# Patient Record
Sex: Female | Born: 1975 | Race: White | Hispanic: No | Marital: Married | Smoking: Never smoker
Health system: Southern US, Community
[De-identification: ages and names within clinical notes are randomized; demographics above are authoritative.]

## PROBLEM LIST (undated history)

## (undated) DIAGNOSIS — F4310 Post-traumatic stress disorder, unspecified: Secondary | ICD-10-CM

## (undated) DIAGNOSIS — F341 Dysthymic disorder: Secondary | ICD-10-CM

## (undated) DIAGNOSIS — T7422XA Child sexual abuse, confirmed, initial encounter: Secondary | ICD-10-CM

## (undated) DIAGNOSIS — I10 Essential (primary) hypertension: Secondary | ICD-10-CM

## (undated) DIAGNOSIS — F411 Generalized anxiety disorder: Secondary | ICD-10-CM

## (undated) HISTORY — PX: TONSILLECTOMY: SUR1361

## (undated) HISTORY — DX: Dysthymic disorder: F34.1

## (undated) HISTORY — DX: Generalized anxiety disorder: F41.1

## (undated) HISTORY — DX: Post-traumatic stress disorder, unspecified: F43.10

## (undated) HISTORY — DX: Child sexual abuse, confirmed, initial encounter: T74.22XA

---

## 2003-10-02 ENCOUNTER — Other Ambulatory Visit: Admission: RE | Admit: 2003-10-02 | Discharge: 2003-10-02 | Payer: Self-pay | Admitting: Family Medicine

## 2005-04-18 ENCOUNTER — Other Ambulatory Visit: Admission: RE | Admit: 2005-04-18 | Discharge: 2005-04-18 | Payer: Self-pay | Admitting: Family Medicine

## 2007-11-29 ENCOUNTER — Encounter: Admission: RE | Admit: 2007-11-29 | Discharge: 2007-11-29 | Payer: Self-pay | Admitting: Family Medicine

## 2008-07-19 ENCOUNTER — Emergency Department (HOSPITAL_COMMUNITY): Admission: EM | Admit: 2008-07-19 | Discharge: 2008-07-19 | Payer: Self-pay | Admitting: Physician Assistant

## 2009-03-23 ENCOUNTER — Emergency Department (HOSPITAL_COMMUNITY): Admission: EM | Admit: 2009-03-23 | Discharge: 2009-03-23 | Payer: Self-pay | Admitting: Emergency Medicine

## 2010-05-15 ENCOUNTER — Ambulatory Visit: Payer: Self-pay | Admitting: Family Medicine

## 2010-05-15 DIAGNOSIS — E669 Obesity, unspecified: Secondary | ICD-10-CM

## 2010-05-16 ENCOUNTER — Telehealth: Payer: Self-pay | Admitting: Family Medicine

## 2010-05-22 ENCOUNTER — Encounter: Payer: Self-pay | Admitting: Family Medicine

## 2010-05-22 ENCOUNTER — Ambulatory Visit: Payer: Self-pay | Admitting: Family Medicine

## 2010-05-22 LAB — CONVERTED CEMR LAB
ALT: 11 units/L (ref 0–35)
AST: 12 units/L (ref 0–37)
Albumin: 4.3 g/dL (ref 3.5–5.2)
Alkaline Phosphatase: 67 units/L (ref 39–117)
BUN: 11 mg/dL (ref 6–23)
CO2: 24 meq/L (ref 19–32)
Calcium: 9 mg/dL (ref 8.4–10.5)
Chloride: 106 meq/L (ref 96–112)
Cholesterol: 182 mg/dL (ref 0–200)
Creatinine, Ser: 0.58 mg/dL (ref 0.40–1.20)
Glucose, Bld: 100 mg/dL — ABNORMAL HIGH (ref 70–99)
HCT: 37.5 % (ref 36.0–46.0)
HCV Ab: NEGATIVE
HDL: 51 mg/dL (ref >39)
Hemoglobin: 11.7 g/dL — ABNORMAL LOW (ref 12.0–15.0)
LDL Cholesterol: 117 mg/dL — ABNORMAL HIGH (ref 0–99)
MCHC: 31.2 g/dL (ref 30.0–36.0)
MCV: 88.4 fL (ref 78.0–100.0)
Platelets: 237 10*3/uL (ref 150–400)
Potassium: 3.9 meq/L (ref 3.5–5.3)
RBC: 4.24 M/uL (ref 3.87–5.11)
RDW: 13.3 % (ref 11.5–15.5)
Sodium: 139 meq/L (ref 135–145)
TSH: 3.211 microintl units/mL (ref 0.350–4.500)
Total Bilirubin: 0.4 mg/dL (ref 0.3–1.2)
Total CHOL/HDL Ratio: 3.6
Total Protein: 6.4 g/dL (ref 6.0–8.3)
Triglycerides: 70 mg/dL (ref <150)
VLDL: 14 mg/dL (ref 0–40)
WBC: 6.9 10*3/uL (ref 4.0–10.5)

## 2010-05-27 ENCOUNTER — Telehealth: Payer: Self-pay | Admitting: Family Medicine

## 2010-05-27 ENCOUNTER — Telehealth (INDEPENDENT_AMBULATORY_CARE_PROVIDER_SITE_OTHER): Payer: Self-pay | Admitting: *Deleted

## 2010-06-12 ENCOUNTER — Ambulatory Visit: Payer: Self-pay | Admitting: Family Medicine

## 2010-06-12 ENCOUNTER — Encounter: Payer: Self-pay | Admitting: Family Medicine

## 2010-06-12 ENCOUNTER — Other Ambulatory Visit
Admission: RE | Admit: 2010-06-12 | Discharge: 2010-06-12 | Payer: Self-pay | Source: Home / Self Care | Admitting: Family Medicine

## 2010-06-12 DIAGNOSIS — F341 Dysthymic disorder: Secondary | ICD-10-CM

## 2010-06-12 LAB — CONVERTED CEMR LAB
Chlamydia, DNA Probe: NEGATIVE
GC Probe Amp, Genital: NEGATIVE
Pap Smear: NEGATIVE
Whiff Test: NEGATIVE

## 2010-06-13 ENCOUNTER — Encounter: Payer: Self-pay | Admitting: Family Medicine

## 2010-08-03 ENCOUNTER — Emergency Department (HOSPITAL_COMMUNITY)
Admission: EM | Admit: 2010-08-03 | Discharge: 2010-08-03 | Payer: Self-pay | Source: Home / Self Care | Admitting: Emergency Medicine

## 2010-08-07 ENCOUNTER — Ambulatory Visit: Payer: Self-pay | Admitting: Family Medicine

## 2010-08-07 DIAGNOSIS — Z9189 Other specified personal risk factors, not elsewhere classified: Secondary | ICD-10-CM | POA: Insufficient documentation

## 2010-08-07 DIAGNOSIS — M791 Myalgia, unspecified site: Secondary | ICD-10-CM | POA: Insufficient documentation

## 2010-08-07 DIAGNOSIS — IMO0001 Reserved for inherently not codable concepts without codable children: Secondary | ICD-10-CM

## 2010-08-27 ENCOUNTER — Ambulatory Visit: Admission: RE | Admit: 2010-08-27 | Discharge: 2010-08-27 | Payer: Self-pay | Source: Home / Self Care

## 2010-08-27 DIAGNOSIS — N63 Unspecified lump in unspecified breast: Secondary | ICD-10-CM | POA: Insufficient documentation

## 2010-08-27 DIAGNOSIS — L909 Atrophic disorder of skin, unspecified: Secondary | ICD-10-CM | POA: Insufficient documentation

## 2010-08-27 DIAGNOSIS — L919 Hypertrophic disorder of the skin, unspecified: Secondary | ICD-10-CM

## 2010-08-27 DIAGNOSIS — R7309 Other abnormal glucose: Secondary | ICD-10-CM | POA: Insufficient documentation

## 2010-08-27 LAB — CONVERTED CEMR LAB: Hgb A1c MFr Bld: 5.6 %

## 2010-09-10 ENCOUNTER — Encounter: Payer: Self-pay | Admitting: Family Medicine

## 2010-09-17 NOTE — Progress Notes (Signed)
  Phone Note Call from Patient   Caller: Patient Call For: [phone] Summary of Call: Ms. Beth Holloway went to pick up meds and discovered it was prescribed for name brand which cost more than she expected. Requesting generic form of meds called to [facility] on [address] so she can get for $5.18. Initial call taken by: Abundio Miu,  May 27, 2010 2:36 PM  Follow-up for Phone Call        to pcp to d/c old med & rx new ones Follow-up by: Golden Circle RN,  May 27, 2010 3:48 PM  Additional Follow-up for Phone Call Additional follow up Details #1::        Please let patient know that Citalopram 20 mg by mouth daily is $4 for a 30 day Rx and $10 for a 90 day Rx. I confirmed on the website. Additional Follow-up by: Helane Rima DO,  May 29, 2010 10:40 AM    Additional Follow-up for Phone Call Additional follow up Details #2::    Left message from Dr. Earlene Plater  on machine with her name. Follow-up by: Starleen Blue RN,  May 29, 2010 11:48 AM

## 2010-09-17 NOTE — Progress Notes (Signed)
Summary: Lab Res  Phone Note Call from Patient Call back at [facility] Phone [phone]   Caller: Patient Summary of Call: Pt checking on lab results. Initial call taken by: Clydell Hakim,  May 27, 2010 11:22 AM  Follow-up for Phone Call        will forward to MD. Follow-up by: Theresia Lo RN,  May 27, 2010 11:50 AM  Additional Follow-up for Phone Call Additional follow up Details #1::        called. no answer. please call again and inform patient that all were normal and we can go over them at her upcoming visit. Additional Follow-up by: Helane Rima DO,  May 27, 2010 12:23 PM    Additional Follow-up for Phone Call Additional follow up Details #2::    patient notified. Follow-up by: Theresia Lo RN,  May 27, 2010 1:37 PM

## 2010-09-17 NOTE — Progress Notes (Signed)
  Phone Note Call from Patient   Caller: Patient Call For: [phone] Summary of Call: Patient was suppose to have rx refilled yesterday, but pharmacy did not have on file.  Please call [facility] on [address] to refill Citalopram 20 mg 30 day supply. Initial call taken by: Abundio Miu,  May 16, 2010 12:19 PM    New/Updated Medications: CITALOPRAM HYDROBROMIDE 20 MG TABS (CITALOPRAM HYDROBROMIDE) take one tablet by mouth daily Prescriptions: CITALOPRAM HYDROBROMIDE 20 MG TABS (CITALOPRAM HYDROBROMIDE) take one tablet by mouth daily  #90 x 3   Entered and Authorized by:   Helane Rima DO   Signed by:   Helane Rima DO on 05/16/2010   Method used:   Electronically to        [facility] (retail)       [address]       [place], [place]  [address]       Ph: [phone]       Fax: [phone]   RxID:   [id number]

## 2010-09-17 NOTE — Assessment & Plan Note (Signed)
Summary: cpe/kh   Vital Signs:  Patient profile:   35 year old female Height:      64.75 inches Weight:      237 pounds BMI:     39.89 Pulse rate:   81 / minute BP sitting:   146 / 83  (left arm) Cuff size:   large  Vitals Entered By: Tessie Fass CMA (June 12, 2010 3:55 PM) CC: CPE with PAP Is Patient Diabetic? No Pain Assessment Patient in pain? no        Primary Care Provider:  Helane Rima  CC:  CPE with PAP.  History of Present Illness: 35 yo F:  1. PAP: No Hx of abnormal PAP. Last done x 2 years ago.   2. ADD: Patient with unclear Hx of ADD, wants formal evaluation.  3. Obesity: Weight loss of 5 pounds since last visit. Not eating after 5 pm. Starting to contemplate exercise.  Habits & Providers  Alcohol-Tobacco-Diet     Tobacco Status: never  Current Medications (verified): 1)  Citalopram Hydrobromide 20 Mg Tabs (Citalopram Hydrobromide) .... Take One Tablet By Mouth Daily  Allergies (verified): No Known Drug Allergies  Past History:  Past medical, surgical, family and social histories (including risk factors) reviewed for relevance to current acute and chronic problems.  Past Medical History: Reviewed history from 05/15/2010 and no changes required. Depression Genital Herpes  Past Surgical History: Reviewed history from 05/15/2010 and no changes required. Tonsillectomy  Family History: Reviewed history from 05/15/2010 and no changes required. Father, alive, age 67. Heart Disease. Mother, alive, age 79. Heart Disease.  Endorses HTN, CAD, arthritis in Family.  Social History: Reviewed history from 05/15/2010 and no changes required. Lives in [place] with mother and mother's roomate. Owns dogs, fish. 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 High school graduate. Never smoked. No recreational drug use. No ETOH use.   Review of Systems General:  Denies chills, fatigue, and fever. CV:  Denies chest pain or discomfort,  palpitations, and shortness of breath with exertion. Resp:  Denies cough, shortness of breath, and wheezing. GI:  Denies abdominal pain, constipation, diarrhea, nausea, and vomiting. GU:  Denies discharge, dysuria, and genital sores. Psych:  Denies anxiety, depression, suicidal thoughts/plans, and thoughts /plans of harming others. Endo:  Denies cold intolerance, excessive hunger, excessive thirst, excessive urination, and heat intolerance.  Physical Exam  General:  Well-developed, well-nourished, in no acute distress; alert, appropriate and cooperative throughout examination. Vitals reviewed. Head:  Normocephalic and atraumatic without obvious abnormalities.  Eyes:  No corneal or conjunctival inflammation noted. EOMI. Perrla. Funduscopic exam benign, without hemorrhages, exudates or papilledema. Vision grossly normal. Ears:  R ear normal and L ear normal.   Nose:  External nasal examination shows no deformity or inflammation. Nasal mucosa are pink and moist without lesions or exudates. Mouth:  Oral mucosa and oropharynx without lesions or exudates.  Teeth in good repair. Neck:  No deformities, masses, or tenderness noted. Lungs:  Normal respiratory effort, chest expands symmetrically. Lungs are clear to auscultation, no crackles or wheezes. Heart:  Normal rate and regular rhythm. S1 and S2 normal without gallop, murmur, click, rub or other extra sounds. Abdomen:  Bowel sounds positive,abdomen soft and non-tender without masses, organomegaly or hernias noted. Genitalia:  Pelvic Exam:        External: normal female genitalia without lesions or masses        Vagina: normal without lesions or masses, small amount thin, white vaginal discharge, samples taken  Cervix: normal without lesions or masses        Adnexa: normal bimanual exam without masses or fullness        Uterus: normal by palpation        Pap smear: performed Msk:  No deformity or scoliosis noted of thoracic or lumbar spine.    Pulses:  R and L dorsalis pedis and posterior tibial pulses are full and equal bilaterally. Extremities:  No clubbing, cyanosis, edema, or deformity noted with normal full range of motion of all joints.   Neurologic:  Alert & oriented X3, cranial nerves II-XII intact, strength normal in all extremities, sensation intact to light touch, gait normal, and DTRs symmetrical and normal.   Psych:  Oriented X3, memory intact for recent and remote, normally interactive, good eye contact, not anxious appearing, and not depressed appearing.     Impression & Recommendations:  Problem # 1:  Gynecological examination-routine (ICD-V72.31) Assessment Unchanged  Problem # 2:  SCREENING FOR MALIGNANT NEOPLASM OF THE CERVIX (ICD-V76.2) Assessment: Unchanged  Orders: Pap Smear-[facility] ([id number]) [facility] - Est  18-39 yrs (21308)  Problem # 3:  VAGINAL DISCHARGE (ICD-623.5) Assessment: Unchanged  Orders: [facility] - Est  18-39 yrs (65784)  Problem # 4:  OBESITY (ICD-278.00) Assessment: Improved  Encourage exercise and healthy diet.  Orders: [facility] - Est  18-39 yrs (69629)  Complete Medication List: 1)  Citalopram Hydrobromide 20 Mg Tabs (Citalopram hydrobromide) .... Take one tablet by mouth daily  Other Orders: GC/Chlamydia-[facility] ([id number]/87491) [facility]41[facility]  Patient Instructions: 1)  It was nice to see you today! 2)  We will call with your lab results.   Orders Added: 1)  GC/Chlamydia-[facility] [87591/87491] 2)  Wet Prep- [facility] [87210] 3)  Pap Smear-[facility] [[id number]] 4)  [facility] - Est  18-39 yrs [[id number]]    Laboratory Results  Date/Time Received: June 12, 2010 4:26 PM  Date/Time Reported: June 12, 2010 4:31 PM   [employer] Source: vag WBC/hpf: >20 Bacteria/hpf: 2+  Rods Clue cells/hpf: none  Negative whiff Yeast/hpf: none Trichomonas/hpf: none Comments: ...............test performed by......Marland KitchenBonnie A. [place], MLS (ASCP)cm

## 2010-09-17 NOTE — Letter (Signed)
Summary: Generic Letter  [facility]  [address]   [place], [place] [address]   Phone: [phone]  Fax: [phone]    06/13/2010  [facility] [address] [place], [place]  [address]  Dear Ms. HARRELSON,  I am happy to inform you that your labs were all normal.  Sincerely,   Helane Rima DO  Appended Document: Generic Letter mailed.

## 2010-09-17 NOTE — Assessment & Plan Note (Signed)
Summary: NP,tcb   Vital Signs:  Patient profile:   35 year old female Height:      164 cm Weight:      242 pounds BMI:     40.96 Temp:     98.2 degrees F oral Pulse rate:   108 / minute BP sitting:   138 / 88  (left arm) Cuff size:   large  Vitals Entered By: Tessie Fass CMA (May 15, 2010 2:46 PM) CC: NEW PT Is Patient Diabetic? No Pain Assessment Patient in pain? no       Hearing Screen  20db HL: Left  500 hz: 20db 1000 hz: 20db 2000 hz: 20db 4000 hz: 20db Right  500 hz: 20db 1000 hz: 20db 2000 hz: 20db 4000 hz: 20db   Hearing Testing Entered By: Tessie Fass CMA (May 15, 2010 3:05 PM)   Primary Care Provider:  Helane Rima  CC:  NEW PT.  History of Present Illness: 35 yo F:  1. Wart: chin, x several months, picks at it. No other lesions/rashes.   2. Difficulty hearing: Hearing screen in office WNL. No Hx of this. Nothing makes better or worse.  3. Check STDs: Ex-boyfriend with Hep C. Hx Herpes. No vaginal discharge. Not sexually active now.   Habits & Providers  Alcohol-Tobacco-Diet     Tobacco Status: never  Current Medications (verified): 1)  None  Allergies (verified): No Known Drug Allergies  Past History:  Past Medical History: Depression Genital Herpes  Past Surgical History: Tonsillectomy  Family History: Father, alive, age 35. Heart Disease. Mother, alive, age 69. Heart Disease.  Endorses HTN, CAD, arthritis in Family.  Social History: Lives in [place] with mother and mother's roomate. Owns dogs, fish. 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 High school graduate. Never smoked. No recreational drug use. No ETOH use. Smoking Status:  never  Review of Systems General:  Denies chills and fever. GI:  Denies change in bowel habits. GU:  Denies abnormal vaginal bleeding, discharge, and genital sores. Derm:  Complains of lesion(s).  Physical Exam  General:  Well-developed, well-nourished, in no  acute distress; alert, appropriate and cooperative throughout examination. Vitals reviewed. Ears:  R ear normal and L ear normal.   Skin:  Chin: Warty growth, irritated end from patient picking at it.   Impression & Recommendations:  Problem # 1:  VERRUCA VULGARIS (ICD-078.10) Assessment New  Cryo today after discussing benefits and risks.  Orders: Cryo (1st lesion) benign - [facility] (17000)  Problem # 2:  CONTACT OR EXPOSURE TO OTHER VIRAL DISEASES (ICD-V01.79) Assessment: New Will have patient schedule a CPE with PAP (last done x 2 years ago). Will ask her to come back fasting for bloodwork prior to visit so that we can discuss the results at the visit. Future Orders: Hep C Ab-[facility] ([id number]) ... 04/25/2011 RPR-[facility] ([id number]) ... 04/25/2011 HIV-[facility] ([id number]) ... 05/02/2011  Problem # 3:  OBESITY (ICD-278.00) Assessment: New  Advised healthy diet and exercise for weight loss. Checking labs.  Future Orders: Comp Met-[facility] [id number]) ... 04/19/2011 Lipid-[facility] ([id number]) ... 04/25/2011 TSH-[facility] ([id number]) ... 04/25/2011  Complete Medication List: 1)  Citalopram Hydrobromide 20 Mg Tabs (Citalopram hydrobromide) .... Take one tablet by mouth daily  Other Orders: Future Orders: CBC-[facility] (03474) ... 04/25/2011  Patient Instructions: 1)  It was nice to meet you today! 2)  Please make an appointment for a PHYSICAL WITH PAP. 3)  Come in fasting for labs prior to the visit.

## 2010-09-19 NOTE — Assessment & Plan Note (Signed)
Summary: knot in breast/eo   Vital Signs:  Patient profile:   35 year old female Height:      64.75 inches Weight:      245 pounds BMI:     41.23 Temp:     97.8 degrees F oral Pulse rate:   80 / minute BP sitting:   144 / 92  (right arm) Cuff size:   large  Vitals Entered By: Tessie Fass CMA (August 27, 2010 9:37 AM) CC: knot in right breast   Primary Care Provider:  Helane Rima  CC:  knot in right breast.  History of Present Illness: 35 yo F:  1. Knot: Right breast, noticed x 2 weeks ago, tender to palpation, seems to be getting slightly bigger with menses, drinks lots of caffeine, no nipple discharge, night sweats, weight loss, FamHx breast cancer, other knots.   2. Skin tags: wants removed, irritated at neck and under right eye.  Current Medications (verified): 1)  Citalopram Hydrobromide 20 Mg Tabs (Citalopram Hydrobromide) .... Take One Tablet By Mouth Daily  Allergies (verified): No Known Drug Allergies PMH-FH-SH reviewed for relevance  Review of Systems      See HPI  Physical Exam  General:  Well-developed, well-nourished, in no acute distress; alert, appropriate and cooperative throughout examination. Vitals reviewed. Breasts:  Fibrocystic change bilaterally. Cyst ( ~2-3 cm diameter) palpated at 10 o'clock right breast near skin surface. TTP. No skin changes or nipple discharge. Skin:  Skin tag (1) under right eye, (2) right neck, and wart left chin. Axillary Nodes:  No palpable lymphadenopathy   Impression & Recommendations:  Problem # 1:  BREAST MASS, BENIGN (ICD-611.72) Assessment New  Patient with fibrocystic change. Mass c/w cyst. Red flags given.  Orders: [facility]- Est Level  3 ([address])  Problem # 2:  SKIN TAG (ICD-701.9) Assessment: New  Consent obtained. Skin tags frozen using liquid nitrogen. No complications. Patient tolerated well.  Orders: [facility]- Est Level  3 (99213) Skin Tags (up to 15) - [facility] (11200)  Complete Medication List: 1)   Citalopram Hydrobromide 20 Mg Tabs (Citalopram hydrobromide) .... Take one tablet by mouth daily  Other Orders: A1C-[facility] (60454)  Patient Instructions: 1)  It was nice to see you today!   Orders Added: 1)  A1C-[facility] [83036] 2)  [facility]- Est Level  3 [09811] 3)  Skin Tags (up to 15) [facility] [11200]    Laboratory Results   Blood Tests   Date/Time Received: August 27, 2010 10:18 AM  Date/Time Reported: August 27, 2010 10:45 AM   HGBA1C: 5.6%   (Normal Range: Non-Diabetic - 3-6%   Control Diabetic - 6-8%)  Comments: ...............test performed by......Marland KitchenBonnie A. [place]az[place], MLS (ASCP)cm
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
## 2010-09-19 NOTE — Progress Notes (Signed)
Summary: [facility] [employer]  [facility] [employer]   Imported By: De Nurse 09/10/2010 16:59:34  _____________________________________________________________________  External Attachment:    Type:   Image     Comment:   External Document

## 2010-09-24 ENCOUNTER — Encounter: Payer: Self-pay | Admitting: *Deleted

## 2010-11-07 ENCOUNTER — Telehealth: Payer: Self-pay | Admitting: Family Medicine

## 2010-11-07 NOTE — Telephone Encounter (Signed)
Called pt and she reports that she has been diagnosed with ADD in middle school and would like to be tested again. I gave the pt the info on ADHD Clinic at [facility]. Pt will call them. Beth Holloway

## 2010-11-07 NOTE — Telephone Encounter (Signed)
Would like a call back regarding information about the adult ADD clinic.  Please call asap.

## 2010-11-23 LAB — POCT I-STAT, CHEM 8
BUN: 12 mg/dL (ref 6–23)
Calcium, Ion: 1.08 mmol/L — ABNORMAL LOW (ref 1.12–1.32)
Creatinine, Ser: 0.7 mg/dL (ref 0.4–1.2)
Glucose, Bld: 96 mg/dL (ref 70–99)
TCO2: 23 mmol/L (ref 0–100)

## 2010-11-23 LAB — POCT PREGNANCY, URINE: Preg Test, Ur: NEGATIVE

## 2011-01-23 ENCOUNTER — Other Ambulatory Visit: Payer: Self-pay | Admitting: Dermatology

## 2011-01-27 ENCOUNTER — Telehealth: Payer: Self-pay | Admitting: Family Medicine

## 2011-01-27 ENCOUNTER — Telehealth: Payer: Self-pay | Admitting: *Deleted

## 2011-01-27 NOTE — Telephone Encounter (Signed)
Has a question about the pill she is taking for depression making her sick on the stomach.  She switched pharmacies and the medication looks different.

## 2011-01-27 NOTE — Telephone Encounter (Signed)
Called pt and advised to schedule office visit with her PCP. Pt agreed .Beth Holloway

## 2011-05-05 ENCOUNTER — Encounter: Payer: Self-pay | Admitting: Family Medicine

## 2011-05-05 ENCOUNTER — Telehealth: Payer: Self-pay | Admitting: Family Medicine

## 2011-05-05 ENCOUNTER — Ambulatory Visit (INDEPENDENT_AMBULATORY_CARE_PROVIDER_SITE_OTHER): Payer: 59 | Admitting: Family Medicine

## 2011-05-05 DIAGNOSIS — F4310 Post-traumatic stress disorder, unspecified: Secondary | ICD-10-CM | POA: Insufficient documentation

## 2011-05-05 DIAGNOSIS — F341 Dysthymic disorder: Secondary | ICD-10-CM | POA: Insufficient documentation

## 2011-05-05 DIAGNOSIS — F411 Generalized anxiety disorder: Secondary | ICD-10-CM | POA: Insufficient documentation

## 2011-05-05 MED ORDER — CLONAZEPAM 0.5 MG PO TABS: 0.5000 mg | ORAL_TABLET | Freq: Two times a day (BID) | ORAL | Status: DC

## 2011-05-05 NOTE — Telephone Encounter (Signed)
Beth Holloway's best friend is calling for Beth Holloway.  She wants to speak with someone about Beth Holloway.  Beth Holloway's mom committed suicide yesterday, and Beth Holloway found her.  She isn't sleeping but she needs some help.  The friend says she is already very depressed

## 2011-05-05 NOTE — Telephone Encounter (Signed)
Will discuss with PCP before calling back.

## 2011-05-05 NOTE — Telephone Encounter (Signed)
Beth Holloway is calling after bringing her friend Beth Holloway in to see Dr. Edmonia James.  She wasn't sure if the medication she was prescribed would help [employer] be able to sleep and she would like someone to call her back as soon as possible.

## 2011-05-05 NOTE — Telephone Encounter (Signed)
Fwd. To Dr.Caviness

## 2011-05-05 NOTE — Patient Instructions (Signed)
Take klonopin 1 tablet 2 x day. Return on Wednesday to see Dr. Denyse Amass.

## 2011-05-05 NOTE — Progress Notes (Signed)
  Subjective:    Patient ID: Petra Kuba, female    DOB: April 05, 1976, 35 y.o.   MRN: [id number]  HPI Anxiety/grief reaction: Patient name endorses anxiety, tearfulness, grief, "I don't feel like I can go on", since the death of her mother yesterday morning. Patient lives with mother and found her dead upon awakening yesterday morning. Janeice Robinson, telephone [phone], if the patient at today's visit. Friend states that she is so upset that she cannot focus to make funeral arrangements. Patient states that she is very sad and tearful, but denies SI or HI.   Review of Systems As per above.    Objective:   Physical Exam  Constitutional: She is oriented to person, place, and time. She appears distressed.  Cardiovascular: Normal rate.   Pulmonary/Chest: Effort normal. No respiratory distress.  Musculoskeletal: Normal range of motion. She exhibits no edema.  Neurological: She is alert and oriented to person, place, and time.  Psychiatric: Judgment and thought content normal. Her mood appears anxious. Her affect is labile. Her speech is delayed. She is agitated. Cognition and memory are normal. She expresses no suicidal plans and no homicidal plans. She is inattentive.          Assessment & Plan:

## 2011-05-05 NOTE — Telephone Encounter (Signed)
Patient's mother committed suicide and she found her.  She has been experiencing panic attacks since and is unable to sleep.  Advised her to come in this am to be seen.   Gave her an appointment with the cross cover MD.

## 2011-05-05 NOTE — Assessment & Plan Note (Signed)
Patient has long history of anxiety/ depression. Now with acute worsening with death of mother. We'll give a short term Klonopin to patient. May need to titrate up SSRI. Patient will stay with friend, Wilkie Aye, during this hard time.  Patient to return to followup with Dr. Denyse Amass in 2 days, or sooner if needed.

## 2011-05-07 ENCOUNTER — Ambulatory Visit: Payer: 59 | Admitting: Family Medicine

## 2011-05-13 ENCOUNTER — Telehealth: Payer: Self-pay | Admitting: Family Medicine

## 2011-05-13 NOTE — Telephone Encounter (Signed)
Pt has appt with Dr.Corey on Friday .Beth Holloway

## 2011-05-13 NOTE — Telephone Encounter (Signed)
Pt is running out of Clonazepam and is asking for more since it is still hard for her to deal with her mother's death.  pls advise. Out pt Pharm

## 2011-05-13 NOTE — Telephone Encounter (Signed)
Will fwd. To Dr.Caviness .Arlyss Repress

## 2011-05-15 NOTE — Telephone Encounter (Signed)
Pt will need to discuss with Dr. Denyse Amass on Friday.

## 2011-05-16 ENCOUNTER — Encounter: Payer: Self-pay | Admitting: Family Medicine

## 2011-05-16 ENCOUNTER — Ambulatory Visit (INDEPENDENT_AMBULATORY_CARE_PROVIDER_SITE_OTHER): Payer: 59 | Admitting: Family Medicine

## 2011-05-16 VITALS — BP 145/84 | HR 91 | Wt 235.0 lb

## 2011-05-16 DIAGNOSIS — F341 Dysthymic disorder: Secondary | ICD-10-CM

## 2011-05-16 DIAGNOSIS — I10 Essential (primary) hypertension: Secondary | ICD-10-CM

## 2011-05-16 MED ORDER — HYDROCHLOROTHIAZIDE 25 MG PO TABS: 25.0000 mg | ORAL_TABLET | Freq: Every day | ORAL | Status: DC

## 2011-05-16 MED ORDER — CITALOPRAM HYDROBROMIDE 40 MG PO TABS: 40.0000 mg | ORAL_TABLET | Freq: Every day | ORAL | Status: DC

## 2011-05-16 MED ORDER — CLONAZEPAM 0.5 MG PO TABS: 0.5000 mg | ORAL_TABLET | Freq: Every day | ORAL | Status: DC

## 2011-05-16 NOTE — Patient Instructions (Signed)
Thank you for coming in today. Take the klonipins at night as needed for insomnia and anxiety. I increased the dose of citalopram to 40mg . Start taking that. Start taking HCTZ for blood pressure.  Come back to see me in 2 weeks.

## 2011-05-17 ENCOUNTER — Encounter: Payer: Self-pay | Admitting: Family Medicine

## 2011-05-17 NOTE — Assessment & Plan Note (Signed)
Dealing with acute grief and some PTSD components today.  Plan to increase celexa to 40 daily. Will also continue klonipin 0.5 at night.  Will f/u in 2 weeks.

## 2011-05-17 NOTE — Assessment & Plan Note (Signed)
Diagnosis of HTN based on multiple elevated BP recordings.  Plan to start HCTZ and f/u in 2 weeks.  Also dicussed weight loss and exercise.

## 2011-05-17 NOTE — Progress Notes (Signed)
Katiria presents to clinic today to follow up her mood.   1) Mood: Recently had a very mentally traumatic event. She found her mother's body following a suicide. Lan was seen by Dr. Edmonia James 2 weeks ago who started BID klonipin and continued celexa 20. Toyna additionally is receiving counseling at [facility] for PTSD from her childhood. She has an appointment with psychiatry there in the next few weeks.  She thinks that overall she is doing better however she continues to have decreased sleep, nightmares and is thinking about her mom a lot. No SI/HI feelings.  Will be going back to work this Monday.   2) HTN: Blood pressure elevated at multiple visits. In the past was on BP medications. No CP, palpitations, syncope or DOE.   PMH reviewed.  ROS as above otherwise neg Medications reviewed.  Exam:  BP 145/84  Pulse 91  Wt 235 lb (106.595 kg) Gen: Well NAD HEENT: EOMI,  MMM Lungs: CTABL Nl WOB Heart: RRR no MRG Abd: NABS, NT, ND Exts: Non edematous BL  LE, warm and well perfused.  Psych: Normal affect and attention. Normal speech. Mood appropriate. No SI/HI.

## 2011-05-21 ENCOUNTER — Telehealth: Payer: Self-pay | Admitting: Family Medicine

## 2011-05-21 NOTE — Telephone Encounter (Signed)
Patient needs you to call her regarding her job.  She is saying that [employer] is wanting her to resign because she is not able to function at work.  She does not want to lose her job.  She really needs to talk with you as soon as possible.

## 2011-05-22 ENCOUNTER — Telehealth: Payer: Self-pay | Admitting: Family Medicine

## 2011-05-22 NOTE — Telephone Encounter (Signed)
Patient came in a signed a release form for you to talk to her supervisor, Rosaland Lao at [phone].  I am having ROI scanned into her chart.

## 2011-05-22 NOTE — Telephone Encounter (Signed)
Left a message asking patient to call me back. Also sent a text message to [facility] asking for any help she may offer.

## 2011-05-23 ENCOUNTER — Telehealth: Payer: Self-pay | Admitting: Family Medicine

## 2011-05-23 LAB — POCT I-STAT, CHEM 8
BUN: 10 mg/dL (ref 6–23)
Calcium, Ion: 1.03 mmol/L — ABNORMAL LOW (ref 1.12–1.32)
Creatinine, Ser: 0.7 mg/dL (ref 0.4–1.2)
Glucose, Bld: 114 mg/dL — ABNORMAL HIGH (ref 70–99)
TCO2: 23 mmol/L (ref 0–100)

## 2011-05-23 NOTE — Telephone Encounter (Signed)
Please call patient regarding a letter she has been requesting for her FMLA.  She is needing to have this set up asap so she can pay her bills

## 2011-05-23 NOTE — Telephone Encounter (Signed)
I called and left a message for Chitara, [employer] boss.  She will page me.

## 2011-05-26 ENCOUNTER — Telehealth: Payer: Self-pay | Admitting: Family Medicine

## 2011-05-26 NOTE — Telephone Encounter (Signed)
Patient filled out a release of info for you to be able to talk to [employer] representative - Ewing Schlein  [phone].  It will be scanned into her chart.

## 2011-05-29 ENCOUNTER — Telehealth: Payer: Self-pay | Admitting: Family Medicine

## 2011-05-29 ENCOUNTER — Encounter: Payer: Self-pay | Admitting: Family Medicine

## 2011-05-29 NOTE — Telephone Encounter (Signed)
Ms. Beth Holloway is calling to find out if she can go back to work.

## 2011-05-29 NOTE — Telephone Encounter (Signed)
Discussed the case with [name]. Beth Holloway  will likely be dismissed from [employer] if she does not resign. She may have some economic benefits residing versus quitting. Beth Holloway will see [name] tomorrow and be referred to legal counseling.  From an emotional standpoint Beth Holloway's recent trauma likely is exacerbating an underlying inability to perform. She'll be referred to free counseling following termination of her Beth Holloway.  I will followup.

## 2011-06-03 ENCOUNTER — Emergency Department (HOSPITAL_COMMUNITY)
Admission: EM | Admit: 2011-06-03 | Discharge: 2011-06-04 | Disposition: A | Discharge status: Other Acute Inpatient Hospital | Payer: 59 | Attending: Emergency Medicine | Admitting: Emergency Medicine

## 2011-06-03 DIAGNOSIS — T43294A Poisoning by other antidepressants, undetermined, initial encounter: Secondary | ICD-10-CM | POA: Insufficient documentation

## 2011-06-03 DIAGNOSIS — T43502A Poisoning by unspecified antipsychotics and neuroleptics, intentional self-harm, initial encounter: Secondary | ICD-10-CM | POA: Insufficient documentation

## 2011-06-03 DIAGNOSIS — F3289 Other specified depressive episodes: Secondary | ICD-10-CM | POA: Insufficient documentation

## 2011-06-03 DIAGNOSIS — R404 Transient alteration of awareness: Secondary | ICD-10-CM | POA: Insufficient documentation

## 2011-06-03 DIAGNOSIS — T424X4A Poisoning by benzodiazepines, undetermined, initial encounter: Secondary | ICD-10-CM | POA: Insufficient documentation

## 2011-06-03 DIAGNOSIS — F329 Major depressive disorder, single episode, unspecified: Secondary | ICD-10-CM | POA: Insufficient documentation

## 2011-06-04 ENCOUNTER — Inpatient Hospital Stay (HOSPITAL_COMMUNITY)
Admission: AD | Admit: 2011-06-04 | Discharge: 2011-06-06 | DRG: 885 | Disposition: A | Discharge status: Home / Self Care | Payer: 59 | Source: Ambulatory Visit | Attending: Psychiatry | Admitting: Psychiatry

## 2011-06-04 DIAGNOSIS — F410 Panic disorder [episodic paroxysmal anxiety] without agoraphobia: Secondary | ICD-10-CM

## 2011-06-04 DIAGNOSIS — F411 Generalized anxiety disorder: Secondary | ICD-10-CM

## 2011-06-04 DIAGNOSIS — T438X2A Poisoning by other psychotropic drugs, intentional self-harm, initial encounter: Secondary | ICD-10-CM

## 2011-06-04 DIAGNOSIS — Z79899 Other long term (current) drug therapy: Secondary | ICD-10-CM

## 2011-06-04 DIAGNOSIS — T43294A Poisoning by other antidepressants, undetermined, initial encounter: Secondary | ICD-10-CM

## 2011-06-04 DIAGNOSIS — I10 Essential (primary) hypertension: Secondary | ICD-10-CM

## 2011-06-04 DIAGNOSIS — F4310 Post-traumatic stress disorder, unspecified: Secondary | ICD-10-CM

## 2011-06-04 DIAGNOSIS — T43502A Poisoning by unspecified antipsychotics and neuroleptics, intentional self-harm, initial encounter: Secondary | ICD-10-CM

## 2011-06-04 DIAGNOSIS — T424X4A Poisoning by benzodiazepines, undetermined, initial encounter: Secondary | ICD-10-CM

## 2011-06-04 DIAGNOSIS — IMO0002 Reserved for concepts with insufficient information to code with codable children: Secondary | ICD-10-CM

## 2011-06-04 DIAGNOSIS — R82998 Other abnormal findings in urine: Secondary | ICD-10-CM

## 2011-06-04 DIAGNOSIS — Z560 Unemployment, unspecified: Secondary | ICD-10-CM

## 2011-06-04 DIAGNOSIS — F339 Major depressive disorder, recurrent, unspecified: Principal | ICD-10-CM

## 2011-06-04 LAB — URINALYSIS, ROUTINE W REFLEX MICROSCOPIC
Glucose, UA: NEGATIVE mg/dL
Ketones, ur: NEGATIVE mg/dL
Nitrite: NEGATIVE
Protein, ur: NEGATIVE mg/dL
Urobilinogen, UA: 1 mg/dL (ref 0.0–1.0)

## 2011-06-04 LAB — POCT PREGNANCY, URINE: Preg Test, Ur: NEGATIVE

## 2011-06-04 LAB — DIFFERENTIAL
Basophils Relative: 0 % (ref 0–1)
Eosinophils Absolute: 0.2 10*3/uL (ref 0.0–0.7)
Lymphs Abs: 2.2 10*3/uL (ref 0.7–4.0)
Monocytes Absolute: 0.8 10*3/uL (ref 0.1–1.0)
Monocytes Relative: 9 % (ref 3–12)

## 2011-06-04 LAB — COMPREHENSIVE METABOLIC PANEL
ALT: 12 U/L (ref 0–35)
AST: 12 U/L (ref 0–37)
CO2: 24 mEq/L (ref 19–32)
Calcium: 9.2 mg/dL (ref 8.4–10.5)
Chloride: 103 mEq/L (ref 96–112)
Creatinine, Ser: 0.68 mg/dL (ref 0.50–1.10)
GFR calc Af Amer: >90 mL/min (ref >90)
GFR calc non Af Amer: >90 mL/min (ref >90)
Glucose, Bld: 128 mg/dL — ABNORMAL HIGH (ref 70–99)
Sodium: 136 mEq/L (ref 135–145)
Total Bilirubin: 0.1 mg/dL — ABNORMAL LOW (ref 0.3–1.2)

## 2011-06-04 LAB — CBC
MCH: 28 pg (ref 26.0–34.0)
MCHC: 32.5 g/dL (ref 30.0–36.0)
MCV: 86.1 fL (ref 78.0–100.0)
Platelets: 230 10*3/uL (ref 150–400)

## 2011-06-04 LAB — RAPID URINE DRUG SCREEN, HOSP PERFORMED
Amphetamines: NOT DETECTED
Benzodiazepines: NOT DETECTED
Tetrahydrocannabinol: NOT DETECTED

## 2011-06-04 LAB — URINE MICROSCOPIC-ADD ON

## 2011-06-04 LAB — SALICYLATE LEVEL: Salicylate Lvl: <2 mg/dL — ABNORMAL LOW (ref 2.8–20.0)

## 2011-06-04 LAB — PROTIME-INR: Prothrombin Time: 12.3 seconds (ref 11.6–15.2)

## 2011-06-04 NOTE — Telephone Encounter (Signed)
Has this been addressed?

## 2011-06-05 DIAGNOSIS — F322 Major depressive disorder, single episode, severe without psychotic features: Secondary | ICD-10-CM

## 2011-06-05 NOTE — Telephone Encounter (Signed)
Have attempted to call several times and been unable to get through. [name] was able to find out this morning that Ms Beth Holloway is an inpatient at [facility].

## 2011-06-06 LAB — URINALYSIS, ROUTINE W REFLEX MICROSCOPIC
Bilirubin Urine: NEGATIVE
Glucose, UA: NEGATIVE mg/dL
Protein, ur: 30 mg/dL — AB

## 2011-06-06 LAB — TSH: TSH: 5.912 u[IU]/mL — ABNORMAL HIGH (ref 0.350–4.500)

## 2011-06-06 LAB — URINE MICROSCOPIC-ADD ON
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
## 2011-06-09 NOTE — Discharge Summary (Signed)
  Beth Holloway, Beth Holloway NO.:  [id number]  MEDICAL RECORD NO.:  [id number]  LOCATION:  0502                          FACILITY:  BH  PHYSICIAN:  Franchot Gallo, MD     DATE OF BIRTH:  May 17, 1976  DATE OF ADMISSION:  06/04/2011 DATE OF DISCHARGE:  06/06/2011                              DISCHARGE SUMMARY   REASON FOR ADMISSION:  The patient presented after an overdose of Celexa and Klonopin which she states that she had taken to "calm down."  She had found her mother dead in 04-May-2023 of this year, having problems with anxiety, also other significant stressors.  She lost her job, having problems with finances.  FINAL IMPRESSION:  Axis I:  Major depressive disorder, recurrent; generalized anxiety disorder; panic disorder without agoraphobia.  PTSD. Axis II:  Deferred. Axis III:  Hypertension. Axis IV:  Recent death of mother.  Recent loss of job.  Financial constraints. Axis V:  At discharge 66.  Counselor had contacted the patient's cousin to address any safety issues and for [place] to provide information.  We started the patient on Wellbutrin to help with her depressive symptoms.  The patient had received a good benefit from that medication in the past.  The patient was doing well, reporting good support at home and was anxious to return back to her living situation.  On day of discharge, the patient was seen in the interdisciplinary treatment team.  Her sleep was good, appetite good, having mild depressive symptoms rating it 2 on a scale of 1-10, adamantly denied any suicidal or homicidal thoughts or psychotic symptoms and having no medication side effects.  DISCHARGE MEDICATIONS:  Included Wellbutrin 150 mg XR 1 at 8 and at 2. Hydrochlorothiazide 25 mg 1 daily. The patient was to stop taking her Celexa.  FOLLOW UP APPOINTMENT:  At [facility] at the walk-in clinic, phone number [phone].  [facility] phone number [phone] and was provided  information on hospice support groups at phone number [phone].     Landry Corporal, N.P.   ______________________________ Franchot Gallo, MD    JO/MEDQ  D:  06/09/2011  T:  06/09/2011  Job:  [id number]  Electronically Signed by Limmie PatriciaP. on 06/09/2011 01:30:03 PM Electronically Signed by Franchot Gallo MD on 06/09/2011 02:04:20 PM

## 2011-09-11 ENCOUNTER — Inpatient Hospital Stay (HOSPITAL_COMMUNITY)
Admission: AD | Admit: 2011-09-11 | Discharge: 2011-09-11 | Disposition: A | Discharge status: Home / Self Care | Payer: 59 | Source: Ambulatory Visit | Attending: Obstetrics & Gynecology | Admitting: Obstetrics & Gynecology

## 2011-09-11 ENCOUNTER — Encounter (HOSPITAL_COMMUNITY): Payer: Self-pay | Admitting: *Deleted

## 2011-09-11 DIAGNOSIS — N939 Abnormal uterine and vaginal bleeding, unspecified: Secondary | ICD-10-CM

## 2011-09-11 DIAGNOSIS — N949 Unspecified condition associated with female genital organs and menstrual cycle: Secondary | ICD-10-CM | POA: Insufficient documentation

## 2011-09-11 DIAGNOSIS — I10 Essential (primary) hypertension: Secondary | ICD-10-CM | POA: Insufficient documentation

## 2011-09-11 DIAGNOSIS — N926 Irregular menstruation, unspecified: Secondary | ICD-10-CM

## 2011-09-11 DIAGNOSIS — N938 Other specified abnormal uterine and vaginal bleeding: Secondary | ICD-10-CM | POA: Insufficient documentation

## 2011-09-11 HISTORY — DX: Essential (primary) hypertension: I10

## 2011-09-11 LAB — CBC
MCH: 26.4 pg (ref 26.0–34.0)
MCHC: 30.9 g/dL (ref 30.0–36.0)
Platelets: 294 10*3/uL (ref 150–400)
RBC: 3.9 MIL/uL (ref 3.87–5.11)

## 2011-09-11 LAB — WET PREP, GENITAL: Yeast Wet Prep HPF POC: NONE SEEN

## 2011-09-11 MED ORDER — MEDROXYPROGESTERONE ACETATE 10 MG PO TABS: 10.0000 mg | ORAL_TABLET | Freq: Every day | ORAL | Status: DC

## 2011-09-11 NOTE — Progress Notes (Signed)
Pt states has had heavy bleeding with some clots over the last 3 months-states it is heavy menstral bleeding and lasts 2 weeks-currently has been bleeding since Jan 13th and it is now slowing down-denies cramping but feels sore in her ovaries-pt also states her mother commited sucided in Sept and she was the one to find her and has been depressed since then-pt states she sleeps all the time-has not had her depression med refeilled and is not taking her hight blool pressure pill
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
## 2011-09-11 NOTE — Progress Notes (Signed)
Pt in c/o heavy bleeding since 08/31/11.  Passing large clots.  States she had been going through a pack of pads a day.  States it has slowed down today, this afternoon.  Denies any dizziness or lightheadedness.  Has hx of hypertension, was prescribed HCTZ, has never taken it.

## 2011-09-12 ENCOUNTER — Encounter: Payer: Self-pay | Admitting: Obstetrics & Gynecology

## 2011-10-01 ENCOUNTER — Other Ambulatory Visit: Payer: Self-pay | Admitting: Advanced Practice Midwife

## 2011-10-01 ENCOUNTER — Encounter: Payer: Self-pay | Admitting: Advanced Practice Midwife

## 2011-10-01 ENCOUNTER — Ambulatory Visit (INDEPENDENT_AMBULATORY_CARE_PROVIDER_SITE_OTHER): Payer: Self-pay | Admitting: Advanced Practice Midwife

## 2011-10-01 VITALS — BP 176/105 | HR 93 | Temp 97.3°F | Resp 20 | Ht 64.5 in | Wt 243.2 lb

## 2011-10-01 DIAGNOSIS — Z01419 Encounter for gynecological examination (general) (routine) without abnormal findings: Secondary | ICD-10-CM

## 2011-10-01 DIAGNOSIS — N920 Excessive and frequent menstruation with regular cycle: Secondary | ICD-10-CM

## 2011-10-01 DIAGNOSIS — Z0000 Encounter for general adult medical examination without abnormal findings: Secondary | ICD-10-CM

## 2011-10-01 NOTE — Progress Notes (Signed)
Pt states that she bled continuously from 08/31/11 until 3 days ago.
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
## 2011-10-01 NOTE — Patient Instructions (Signed)

## 2011-10-08 ENCOUNTER — Ambulatory Visit (HOSPITAL_COMMUNITY): Payer: Self-pay

## 2011-10-29 ENCOUNTER — Ambulatory Visit: Payer: Self-pay | Admitting: Family Medicine

## 2012-03-03 ENCOUNTER — Emergency Department (HOSPITAL_COMMUNITY)
Admission: EM | Admit: 2012-03-03 | Discharge: 2012-03-04 | Disposition: A | Discharge status: Home / Self Care | Payer: Self-pay | Attending: Emergency Medicine | Admitting: Emergency Medicine

## 2012-03-03 DIAGNOSIS — F411 Generalized anxiety disorder: Secondary | ICD-10-CM | POA: Insufficient documentation

## 2012-03-03 DIAGNOSIS — F341 Dysthymic disorder: Secondary | ICD-10-CM | POA: Insufficient documentation

## 2012-03-03 DIAGNOSIS — I10 Essential (primary) hypertension: Secondary | ICD-10-CM | POA: Insufficient documentation

## 2012-03-03 DIAGNOSIS — Z8249 Family history of ischemic heart disease and other diseases of the circulatory system: Secondary | ICD-10-CM | POA: Insufficient documentation

## 2012-03-03 DIAGNOSIS — F419 Anxiety disorder, unspecified: Secondary | ICD-10-CM

## 2012-03-03 DIAGNOSIS — F4310 Post-traumatic stress disorder, unspecified: Secondary | ICD-10-CM | POA: Insufficient documentation

## 2012-03-03 DIAGNOSIS — R079 Chest pain, unspecified: Secondary | ICD-10-CM | POA: Insufficient documentation

## 2012-03-03 DIAGNOSIS — F410 Panic disorder [episodic paroxysmal anxiety] without agoraphobia: Secondary | ICD-10-CM

## 2012-03-03 DIAGNOSIS — Z818 Family history of other mental and behavioral disorders: Secondary | ICD-10-CM | POA: Insufficient documentation

## 2012-03-03 NOTE — ED Notes (Signed)
Brought in by EMS from home with c/o chest pain.  Per EMS, pt was hyperventilating upon their arrival, pt also reported to EMS that she had emesis prior to their arrival.  Presents to room without apparent distress.

## 2012-03-03 NOTE — ED Notes (Signed)
[id number]<AV> Expected date:<BR> Expected time:<BR> Means of arrival:<BR> Comments:<BR> EMS/chest pain/hyperventilating

## 2012-03-04 ENCOUNTER — Encounter (HOSPITAL_COMMUNITY): Payer: Self-pay | Admitting: Emergency Medicine

## 2012-03-04 LAB — CBC
HCT: 35.1 % — ABNORMAL LOW (ref 36.0–46.0)
Hemoglobin: 10.8 g/dL — ABNORMAL LOW (ref 12.0–15.0)
MCH: 24.9 pg — ABNORMAL LOW (ref 26.0–34.0)
MCV: 81.1 fL (ref 78.0–100.0)
RBC: 4.33 MIL/uL (ref 3.87–5.11)

## 2012-03-04 LAB — BASIC METABOLIC PANEL
BUN: 9 mg/dL (ref 6–23)
CO2: 24 mEq/L (ref 19–32)
Calcium: 9.4 mg/dL (ref 8.4–10.5)
Creatinine, Ser: 0.61 mg/dL (ref 0.50–1.10)
Glucose, Bld: 96 mg/dL (ref 70–99)

## 2012-03-04 LAB — POCT I-STAT TROPONIN I

## 2012-03-04 NOTE — ED Notes (Signed)
Pt reports that she started having chest pain while driving home--- pain persisted and so, she took "Benicar" as prescribed by her doctor but it did not ease her chest pain. Pt states that pain is "sore-like".
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
## 2012-03-09 NOTE — ED Provider Notes (Signed)
Medical screening examination/treatment/procedure(s) were performed by non-physician practitioner and as supervising physician I was immediately available for consultation/collaboration.  Olivia Mackie, MD 03/09/12 [phone]

## 2012-07-02 ENCOUNTER — Ambulatory Visit (INDEPENDENT_AMBULATORY_CARE_PROVIDER_SITE_OTHER): Payer: Self-pay | Admitting: Family Medicine

## 2012-07-02 ENCOUNTER — Encounter: Payer: Self-pay | Admitting: Family Medicine

## 2012-07-02 VITALS — BP 160/96 | HR 94 | Temp 98.2°F | Ht 64.5 in | Wt 246.3 lb

## 2012-07-02 DIAGNOSIS — R05 Cough: Secondary | ICD-10-CM

## 2012-07-02 MED ORDER — GUAIFENESIN-CODEINE 200-9 MG PO CAPS: 1.0000 | ORAL_CAPSULE | Freq: Three times a day (TID) | ORAL | Status: DC | PRN

## 2012-07-02 MED ORDER — CETIRIZINE HCL 10 MG PO TABS: 10.0000 mg | ORAL_TABLET | Freq: Every day | ORAL | Status: DC

## 2012-07-02 NOTE — Progress Notes (Signed)
  Subjective:     Beth Holloway is a 36 y.o. female who presents for evaluation of productive cough. Symptoms began 2 weeks ago. Symptoms have been gradually worsening since that time.  She also complains of rhinorrhea and green nasal discharge.  She says she has tried "Everything" that is over the counter with no relief.  She denies any SOB, CP, of difficulty breathing.  Denies any nausea/vomiting.  Patient does not smoke.  She says that every year when the weather becomes cold, she has similar symptoms.  Of note, patient does not have any health insurance and says she does not have a job or money for medications.  The following portions of the patient's history were reviewed and updated as appropriate: allergies, current medications, past medical history and problem list.  Review of Systems Pertinent items are noted in HPI.    Objective:    BP 160/96  Pulse 94  Temp 98.2 F (36.8 C) (Oral)  Ht 5' 4.5" (1.638 m)  Wt 246 lb 4.8 oz (111.721 kg)  BMI 41.62 kg/m2 General appearance: alert, cooperative and no distress Head: Normocephalic, without obvious abnormality, atraumatic Nose: Nares normal. Septum midline. Mucosa normal. No drainage or sinus tenderness. Throat: abnormal findings: mild oropharyngeal erythema Lungs: clear to auscultation bilaterally Heart: regular rate and rhythm, S1, S2 normal, no murmur, click, rub or gallop    Assessment:    Bronchitis    Plan:   See Problem List

## 2012-07-02 NOTE — Assessment & Plan Note (Signed)
Likely post-viral cough, less likely pertussis.  Difficult situation since patient has limited resources. - Will treat cough with Mucinex with codeine PRN at bedtime (patient willing to try this despite her allergy) - If there is an allergic component, recommended Zyrtec daily - If cough persists or worsens, patient to return to clinic - consider Azithromycin for pertussis - Gave patient information for orange card - Red flags reviewed with patient and per AVS

## 2012-07-02 NOTE — Patient Instructions (Addendum)
It was good to meet you today, [facility]. Please apply for the orange card to help assist with your office visits and medications. For cough, try taking Mucinex as needed and Zyrtec DAILY. It usually takes about 4 weeks for a post-viral cough to resolve. If cough does not improve in 4 weeks, please return to clinic.  Cough, Adult  A cough is a reflex. It helps you clear your throat and airways. A cough can help heal your body. A cough can last 2 or 3 weeks (acute) or may last more than 8 weeks (chronic). Some common causes of a cough can include an infection, allergy, or a cold. HOME CARE  Only take medicine as told by your doctor.  If given, take your medicines (antibiotics) as told. Finish them even if you start to feel better.  Use a cold steam vaporizer or humidier in your home. This can help loosen thick spit (secretions).  Sleep so you are almost sitting up (semi-upright). Use pillows to do this. This helps reduce coughing.  Rest as needed.  Stop smoking if you smoke. GET HELP RIGHT AWAY IF:  You have yellowish-white fluid (pus) in your thick spit.  Your cough gets worse.  Your medicine does not reduce coughing, and you are losing sleep.  You cough up blood.  You have trouble breathing.  Your pain gets worse and medicine does not help.  You have a fever. MAKE SURE YOU:   Understand these instructions.  Will watch your condition.  Will get help right away if you are not doing well or get worse. Document Released: 04/17/2011 Document Revised: 10/27/2011 Document Reviewed: 04/17/2011 [facility] Patient Information 2013 [place], [place].

## 2012-07-05 ENCOUNTER — Ambulatory Visit: Payer: Self-pay | Admitting: Family Medicine

## 2013-04-27 ENCOUNTER — Ambulatory Visit (INDEPENDENT_AMBULATORY_CARE_PROVIDER_SITE_OTHER): Payer: Self-pay | Admitting: Family

## 2013-04-27 ENCOUNTER — Encounter: Payer: Self-pay | Admitting: Family

## 2013-04-27 VITALS — BP 120/82 | HR 109 | Ht 64.5 in | Wt 248.0 lb

## 2013-04-27 DIAGNOSIS — E039 Hypothyroidism, unspecified: Secondary | ICD-10-CM | POA: Insufficient documentation

## 2013-04-27 DIAGNOSIS — F411 Generalized anxiety disorder: Secondary | ICD-10-CM

## 2013-04-27 DIAGNOSIS — E7800 Pure hypercholesterolemia, unspecified: Secondary | ICD-10-CM

## 2013-04-27 DIAGNOSIS — F329 Major depressive disorder, single episode, unspecified: Secondary | ICD-10-CM

## 2013-04-27 DIAGNOSIS — R112 Nausea with vomiting, unspecified: Secondary | ICD-10-CM

## 2013-04-27 LAB — HEPATIC FUNCTION PANEL
Bilirubin, Direct: 0 mg/dL (ref 0.0–0.3)
Total Bilirubin: 0.4 mg/dL (ref 0.3–1.2)
Total Protein: 7.4 g/dL (ref 6.0–8.3)

## 2013-04-27 LAB — BASIC METABOLIC PANEL
CO2: 24 mEq/L (ref 19–32)
Calcium: 9.5 mg/dL (ref 8.4–10.5)
Creatinine, Ser: 0.8 mg/dL (ref 0.4–1.2)
GFR: 86.36 mL/min (ref >60.00)
Sodium: 139 mEq/L (ref 135–145)

## 2013-04-27 LAB — CBC WITH DIFFERENTIAL/PLATELET
Basophils Absolute: 0 10*3/uL (ref 0.0–0.1)
Eosinophils Absolute: 0.1 10*3/uL (ref 0.0–0.7)
HCT: 36.1 % (ref 36.0–46.0)
Lymphs Abs: 1.5 10*3/uL (ref 0.7–4.0)
MCHC: 33 g/dL (ref 30.0–36.0)
MCV: 79.8 fl (ref 78.0–100.0)
Monocytes Absolute: 0.6 10*3/uL (ref 0.1–1.0)
Neutrophils Relative %: 72.7 % (ref 43.0–77.0)
Platelets: 295 10*3/uL (ref 150.0–400.0)
RDW: 13.9 % (ref 11.5–14.6)

## 2013-04-27 LAB — TSH: TSH: 0.09 u[IU]/mL — ABNORMAL LOW (ref 0.35–5.50)

## 2013-04-27 MED ORDER — ARIPIPRAZOLE 5 MG PO TABS: 10.0000 mg | ORAL_TABLET | Freq: Every day | ORAL | Status: DC
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
## 2013-04-27 NOTE — Patient Instructions (Signed)

## 2014-10-12 ENCOUNTER — Encounter (HOSPITAL_COMMUNITY): Payer: Self-pay | Admitting: Emergency Medicine

## 2014-10-12 ENCOUNTER — Emergency Department (INDEPENDENT_AMBULATORY_CARE_PROVIDER_SITE_OTHER)
Admission: EM | Admit: 2014-10-12 | Discharge: 2014-10-12 | Disposition: A | Discharge status: Home / Self Care | Payer: PRIVATE HEALTH INSURANCE | Source: Home / Self Care | Attending: Family Medicine | Admitting: Family Medicine

## 2014-10-12 DIAGNOSIS — J069 Acute upper respiratory infection, unspecified: Secondary | ICD-10-CM

## 2014-10-12 LAB — POCT RAPID STREP A: Streptococcus, Group A Screen (Direct): NEGATIVE

## 2014-10-12 MED ORDER — IPRATROPIUM BROMIDE 0.06 % NA SOLN: 2.0000 | Freq: Four times a day (QID) | NASAL | Status: DC
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
## 2014-10-12 NOTE — ED Notes (Signed)
C/o sore throat, feels bad, chest congestion, denies fever, onset this am, 2 am

## 2014-10-15 LAB — CULTURE, GROUP A STREP: STREP A CULTURE: NEGATIVE

## 2015-01-15 ENCOUNTER — Encounter (HOSPITAL_COMMUNITY): Payer: Self-pay | Admitting: *Deleted

## 2015-01-15 ENCOUNTER — Emergency Department (INDEPENDENT_AMBULATORY_CARE_PROVIDER_SITE_OTHER)
Admission: EM | Admit: 2015-01-15 | Discharge: 2015-01-15 | Disposition: A | Discharge status: Home / Self Care | Payer: PRIVATE HEALTH INSURANCE | Source: Home / Self Care | Attending: Emergency Medicine | Admitting: Emergency Medicine

## 2015-01-15 DIAGNOSIS — M545 Low back pain, unspecified: Secondary | ICD-10-CM

## 2015-01-15 DIAGNOSIS — K2970 Gastritis, unspecified, without bleeding: Secondary | ICD-10-CM

## 2015-01-15 LAB — POCT URINALYSIS DIP (DEVICE)
Bilirubin Urine: NEGATIVE
GLUCOSE, UA: NEGATIVE mg/dL
Hgb urine dipstick: NEGATIVE
Ketones, ur: 15 mg/dL — AB
Leukocytes, UA: NEGATIVE
Nitrite: NEGATIVE
PH: 6 (ref 5.0–8.0)
PROTEIN: NEGATIVE mg/dL
Urobilinogen, UA: 0.2 mg/dL (ref 0.0–1.0)

## 2015-01-15 LAB — POCT PREGNANCY, URINE: Preg Test, Ur: NEGATIVE

## 2015-01-15 MED ORDER — ONDANSETRON HCL 4 MG PO TABS: 4.0000 mg | ORAL_TABLET | Freq: Three times a day (TID) | ORAL | Status: DC | PRN

## 2015-01-15 MED ORDER — OMEPRAZOLE 20 MG PO CPDR: 20.0000 mg | DELAYED_RELEASE_CAPSULE | Freq: Every day | ORAL | Status: DC

## 2015-01-15 MED ORDER — IBUPROFEN 800 MG PO TABS: 800.0000 mg | ORAL_TABLET | Freq: Three times a day (TID) | ORAL | Status: DC | PRN

## 2015-01-15 NOTE — Discharge Instructions (Signed)
Your back pain is likely related to being on your feet all day. Take ibuprofen 800 mg 3 times a day for the next week, then as needed.  The stomach pain may be gastritis. Take omeprazole daily for the next 2 weeks. Use the Zofran every 8 hours as needed for nausea.  If your pain is getting worse, he started vomiting, you develop fevers, please come back. If you are not getting better after 2 weeks, please come back.

## 2015-01-15 NOTE — ED Notes (Signed)
Pt  Reports  Low   abd  Pain   And  Low  Back  Pain      Off  And  On  For  About  1  Month          Symptoms   Not  releived  By  Motrin        -  Pt  Reports  No  Constipation   denys  Any  Vaginal bleeding /  Discharge
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
## 2015-03-13 ENCOUNTER — Encounter (HOSPITAL_COMMUNITY): Payer: Self-pay | Admitting: Emergency Medicine

## 2015-03-13 ENCOUNTER — Emergency Department (INDEPENDENT_AMBULATORY_CARE_PROVIDER_SITE_OTHER)
Admission: EM | Admit: 2015-03-13 | Discharge: 2015-03-13 | Disposition: A | Discharge status: Home / Self Care | Payer: PRIVATE HEALTH INSURANCE | Source: Home / Self Care | Attending: Emergency Medicine | Admitting: Emergency Medicine

## 2015-03-13 DIAGNOSIS — J3489 Other specified disorders of nose and nasal sinuses: Secondary | ICD-10-CM

## 2015-03-13 DIAGNOSIS — J40 Bronchitis, not specified as acute or chronic: Secondary | ICD-10-CM

## 2015-03-13 MED ORDER — ALBUTEROL SULFATE HFA 108 (90 BASE) MCG/ACT IN AERS: 2.0000 | INHALATION_SPRAY | RESPIRATORY_TRACT | Status: DC | PRN

## 2015-03-13 MED ORDER — IPRATROPIUM-ALBUTEROL 0.5-2.5 (3) MG/3ML IN SOLN
RESPIRATORY_TRACT | Status: AC
  Filled 2015-03-13: qty 3

## 2015-03-13 MED ORDER — HYDROCODONE-HOMATROPINE 5-1.5 MG/5ML PO SYRP: 5.0000 mL | ORAL_SOLUTION | Freq: Four times a day (QID) | ORAL | Status: DC | PRN

## 2015-03-13 MED ORDER — IPRATROPIUM-ALBUTEROL 0.5-2.5 (3) MG/3ML IN SOLN
3.0000 mL | Freq: Once | RESPIRATORY_TRACT | Status: AC
  Administered 2015-03-13: 3 mL via RESPIRATORY_TRACT

## 2015-03-13 MED ORDER — BENZONATATE 100 MG PO CAPS: 100.0000 mg | ORAL_CAPSULE | Freq: Three times a day (TID) | ORAL | Status: DC | PRN

## 2015-03-13 MED ORDER — IPRATROPIUM BROMIDE 0.06 % NA SOLN: 2.0000 | Freq: Four times a day (QID) | NASAL | Status: DC

## 2015-03-13 NOTE — ED Notes (Signed)
Pt states that she has had cough and congestion for the past week.

## 2015-03-13 NOTE — Discharge Instructions (Signed)
You were wheezing in clinic today and we gave you a breathing treatment. For home, please take the tessalon cough medicine during the day and hycodan at night. Please use the atrovent nasal spray.  If you feel like you are wheezing at home or feel short of breath, you can use the albuterol inhaler 1-2 puffs every 4 hours as needed to see if that helps.  Taking mucinex with lots of water can help to break up the chest congestion.   Acute Bronchitis Bronchitis is inflammation of the airways that extend from the windpipe into the lungs (bronchi). The inflammation often causes mucus to develop. This leads to a cough, which is the most common symptom of bronchitis.  In acute bronchitis, the condition usually develops suddenly and goes away over time, usually in a couple weeks. Smoking, allergies, and asthma can make bronchitis worse. Repeated episodes of bronchitis may cause further lung problems.  CAUSES Acute bronchitis is most often caused by the same virus that causes a cold. The virus can spread from person to person (contagious) through coughing, sneezing, and touching contaminated objects. SIGNS AND SYMPTOMS   Cough.   Fever.   Coughing up mucus.   Body aches.   Chest congestion.   Chills.   Shortness of breath.   Sore throat.  DIAGNOSIS  Acute bronchitis is usually diagnosed through a physical exam. Your health care provider will also ask you questions about your medical history. Tests, such as chest X-rays, are sometimes done to rule out other conditions.  TREATMENT  Acute bronchitis usually goes away in a couple weeks. Oftentimes, no medical treatment is necessary. Medicines are sometimes given for relief of fever or cough. Antibiotic medicines are usually not needed but may be prescribed in certain situations. In some cases, an inhaler may be recommended to help reduce shortness of breath and control the cough. A cool mist vaporizer may also be used to help thin bronchial  secretions and make it easier to clear the chest.  HOME CARE INSTRUCTIONS  Get plenty of rest.   Drink enough fluids to keep your urine clear or pale yellow (unless you have a medical condition that requires fluid restriction). Increasing fluids may help thin your respiratory secretions (sputum) and reduce chest congestion, and it will prevent dehydration.   Take medicines only as directed by your health care provider.  If you were prescribed an antibiotic medicine, finish it all even if you start to feel better.  Avoid smoking and secondhand smoke. Exposure to cigarette smoke or irritating chemicals will make bronchitis worse. If you are a smoker, consider using nicotine gum or skin patches to help control withdrawal symptoms. Quitting smoking will help your lungs heal faster.   Reduce the chances of another bout of acute bronchitis by washing your hands frequently, avoiding people with cold symptoms, and trying not to touch your hands to your mouth, nose, or eyes.   Keep all follow-up visits as directed by your health care provider.  SEEK MEDICAL CARE IF: Your symptoms do not improve after 1 week of treatment.  SEEK IMMEDIATE MEDICAL CARE IF:  You develop an increased fever or chills.   You have chest pain.   You have severe shortness of breath.  You have bloody sputum.   You develop dehydration.  You faint or repeatedly feel like you are going to pass out.  You develop repeated vomiting.  You develop a severe headache. MAKE SURE YOU:   Understand these instructions.  Will watch your  condition.  Will get help right away if you are not doing well or get worse. Document Released: 09/11/2004 Document Revised: 12/19/2013 Document Reviewed: 01/25/2013 [facility] Patient Information 2015 [place], [place]. This information is not intended to replace advice given to you by your health care provider. Make sure you discuss any questions you have with your health care  provider.
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
## 2015-07-16 ENCOUNTER — Emergency Department (HOSPITAL_COMMUNITY)
Admission: EM | Admit: 2015-07-16 | Discharge: 2015-07-16 | Disposition: A | Discharge status: Home / Self Care | Payer: Medicaid Other | Attending: Emergency Medicine | Admitting: Emergency Medicine

## 2015-07-16 ENCOUNTER — Encounter (HOSPITAL_COMMUNITY): Payer: Self-pay | Admitting: *Deleted

## 2015-07-16 DIAGNOSIS — R112 Nausea with vomiting, unspecified: Secondary | ICD-10-CM | POA: Insufficient documentation

## 2015-07-16 DIAGNOSIS — R1013 Epigastric pain: Secondary | ICD-10-CM | POA: Insufficient documentation

## 2015-07-16 DIAGNOSIS — Z3202 Encounter for pregnancy test, result negative: Secondary | ICD-10-CM | POA: Insufficient documentation

## 2015-07-16 DIAGNOSIS — I10 Essential (primary) hypertension: Secondary | ICD-10-CM | POA: Insufficient documentation

## 2015-07-16 DIAGNOSIS — R1010 Upper abdominal pain, unspecified: Secondary | ICD-10-CM

## 2015-07-16 DIAGNOSIS — R1011 Right upper quadrant pain: Secondary | ICD-10-CM | POA: Diagnosis not present

## 2015-07-16 LAB — COMPREHENSIVE METABOLIC PANEL
ALBUMIN: 4.7 g/dL (ref 3.5–5.0)
ALK PHOS: 55 U/L (ref 38–126)
ALT: 14 U/L (ref 14–54)
ANION GAP: 9 (ref 5–15)
AST: 14 U/L — ABNORMAL LOW (ref 15–41)
BILIRUBIN TOTAL: 0.7 mg/dL (ref 0.3–1.2)
BUN: 20 mg/dL (ref 6–20)
CO2: 24 mmol/L (ref 22–32)
Calcium: 9 mg/dL (ref 8.9–10.3)
Chloride: 105 mmol/L (ref 101–111)
Creatinine, Ser: 0.69 mg/dL (ref 0.44–1.00)
Glucose, Bld: 122 mg/dL — ABNORMAL HIGH (ref 65–99)
Potassium: 3.9 mmol/L (ref 3.5–5.1)
Sodium: 138 mmol/L (ref 135–145)
TOTAL PROTEIN: 7.6 g/dL (ref 6.5–8.1)

## 2015-07-16 LAB — CBC WITH DIFFERENTIAL/PLATELET
Basophils Absolute: 0 10*3/uL (ref 0.0–0.1)
Basophils Relative: 0 %
Eosinophils Absolute: 0 10*3/uL (ref 0.0–0.7)
Eosinophils Relative: 0 %
HEMATOCRIT: 43 % (ref 36.0–46.0)
HEMOGLOBIN: 13.4 g/dL (ref 12.0–15.0)
LYMPHS ABS: 0.5 10*3/uL — AB (ref 0.7–4.0)
Lymphocytes Relative: 5 %
MCH: 27.5 pg (ref 26.0–34.0)
MCHC: 31.2 g/dL (ref 30.0–36.0)
MCV: 88.1 fL (ref 78.0–100.0)
MONO ABS: 0.5 10*3/uL (ref 0.1–1.0)
MONOS PCT: 5 %
NEUTROS ABS: 9.4 10*3/uL — AB (ref 1.7–7.7)
NEUTROS PCT: 90 %
Platelets: 255 10*3/uL (ref 150–400)
RBC: 4.88 MIL/uL (ref 3.87–5.11)
RDW: 15.1 % (ref 11.5–15.5)
WBC: 10.5 10*3/uL (ref 4.0–10.5)

## 2015-07-16 LAB — URINE MICROSCOPIC-ADD ON

## 2015-07-16 LAB — URINALYSIS, ROUTINE W REFLEX MICROSCOPIC
Bilirubin Urine: NEGATIVE
GLUCOSE, UA: NEGATIVE mg/dL
Hgb urine dipstick: NEGATIVE
KETONES UR: NEGATIVE mg/dL
NITRITE: NEGATIVE
PROTEIN: NEGATIVE mg/dL
Specific Gravity, Urine: 1.029 (ref 1.005–1.030)
pH: 7.5 (ref 5.0–8.0)

## 2015-07-16 LAB — POC URINE PREG, ED: PREG TEST UR: NEGATIVE

## 2015-07-16 LAB — LIPASE, BLOOD: LIPASE: 29 U/L (ref 11–51)

## 2015-07-16 MED ORDER — ONDANSETRON HCL 4 MG/2ML IJ SOLN
4.0000 mg | Freq: Once | INTRAMUSCULAR | Status: AC
  Administered 2015-07-16: 4 mg via INTRAVENOUS
  Filled 2015-07-16: qty 2

## 2015-07-16 MED ORDER — ONDANSETRON HCL 4 MG PO TABS: 4.0000 mg | ORAL_TABLET | Freq: Four times a day (QID) | ORAL | Status: DC

## 2015-07-16 MED ORDER — FAMOTIDINE 20 MG PO TABS
10.0000 mg | ORAL_TABLET | Freq: Once | ORAL | Status: AC
  Administered 2015-07-16: 10 mg via ORAL
  Filled 2015-07-16: qty 1

## 2015-07-16 MED ORDER — ACETAMINOPHEN ER 650 MG PO TBCR: 650.0000 mg | EXTENDED_RELEASE_TABLET | Freq: Three times a day (TID) | ORAL | Status: DC | PRN

## 2015-07-16 MED ORDER — SODIUM CHLORIDE 0.9 % IV BOLUS (SEPSIS)
1000.0000 mL | Freq: Once | INTRAVENOUS | Status: AC
  Administered 2015-07-16: 1000 mL via INTRAVENOUS

## 2015-07-16 NOTE — Discharge Instructions (Signed)
Nausea and Vomiting Follow up with a primary care provider using the resource guide below. Return for inability to tolerate fluids or increased abdominal pain. Nausea is a sick feeling that often comes before throwing up (vomiting). Vomiting is a reflex where stomach contents come out of your mouth. Vomiting can cause severe loss of body fluids (dehydration). Children and elderly adults can become dehydrated quickly, especially if they also have diarrhea. Nausea and vomiting are symptoms of a condition or disease. It is important to find the cause of your symptoms. CAUSES   Direct irritation of the stomach lining. This irritation can result from increased acid production (gastroesophageal reflux disease), infection, food poisoning, taking certain medicines (such as nonsteroidal anti-inflammatory drugs), alcohol use, or tobacco use.  Signals from the brain.These signals could be caused by a headache, heat exposure, an inner ear disturbance, increased pressure in the brain from injury, infection, a tumor, or a concussion, pain, emotional stimulus, or metabolic problems.  An obstruction in the gastrointestinal tract (bowel obstruction).  Illnesses such as diabetes, hepatitis, gallbladder problems, appendicitis, kidney problems, cancer, sepsis, atypical symptoms of a heart attack, or eating disorders.  Medical treatments such as chemotherapy and radiation.  Receiving medicine that makes you sleep (general anesthetic) during surgery. DIAGNOSIS Your caregiver may ask for tests to be done if the problems do not improve after a few days. Tests may also be done if symptoms are severe or if the reason for the nausea and vomiting is not clear. Tests may include:  Urine tests.  Blood tests.  Stool tests.  Cultures (to look for evidence of infection).  X-rays or other imaging studies. Test results can help your caregiver make decisions about treatment or the need for additional tests. TREATMENT You  need to stay well hydrated. Drink frequently but in small amounts.You may wish to drink water, sports drinks, clear broth, or eat frozen ice pops or gelatin dessert to help stay hydrated.When you eat, eating slowly may help prevent nausea.There are also some antinausea medicines that may help prevent nausea. HOME CARE INSTRUCTIONS   Take all medicine as directed by your caregiver.  If you do not have an appetite, do not force yourself to eat. However, you must continue to drink fluids.  If you have an appetite, eat a normal diet unless your caregiver tells you differently.  Eat a variety of complex carbohydrates (rice, wheat, potatoes, bread), lean meats, yogurt, fruits, and vegetables.  Avoid high-fat foods because they are more difficult to digest.  Drink enough water and fluids to keep your urine clear or pale yellow.  If you are dehydrated, ask your caregiver for specific rehydration instructions. Signs of dehydration may include:  Severe thirst.  Dry lips and mouth.  Dizziness.  Dark urine.  Decreasing urine frequency and amount.  Confusion.  Rapid breathing or pulse. SEEK IMMEDIATE MEDICAL CARE IF:   You have blood or brown flecks (like coffee grounds) in your vomit.  You have black or bloody stools.  You have a severe headache or stiff neck.  You are confused.  You have severe abdominal pain.  You have chest pain or trouble breathing.  You do not urinate at least once every 8 hours.  You develop cold or clammy skin.  You continue to vomit for longer than 24 to 48 hours.  You have a fever. MAKE SURE YOU:   Understand these instructions.  Will watch your condition.  Will get help right away if you are not doing well or  get worse.   This information is not intended to replace advice given to you by your health care provider. Make sure you discuss any questions you have with your health care provider.   Document Released: 08/04/2005 Document Revised:  10/27/2011 Document Reviewed: 01/01/2011 Elsevier Interactive Patient Education 2016 [employer]  Emergency Department Resource Guide 1) Find a Doctor and Pay Out of Pocket Although you won't have to find out who is covered by your insurance plan, it is a good idea to ask around and get recommendations. You will then need to call the office and see if the doctor you have chosen will accept you as a new patient and what types of options they offer for patients who are self-pay. Some doctors offer discounts or will set up payment plans for their patients who do not have insurance, but you will need to ask so you aren't surprised when you get to your appointment.  2) Contact Your Local Health Department Not all health departments have doctors that can see patients for sick visits, but many do, so it is worth a call to see if yours does. If you don't know where your local health department is, you can check in your phone book. The CDC also has a tool to help you locate your state's health department, and many state websites also have listings of all of their local health departments.  3) Find a Walk-in Clinic If your illness is not likely to be very severe or complicated, you may want to try a walk in clinic. These are popping up all over the country in pharmacies, drugstores, and shopping centers. They're usually staffed by nurse practitioners or physician assistants that have been trained to treat common illnesses and complaints. They're usually fairly quick and inexpensive. However, if you have serious medical issues or chronic medical problems, these are probably not your best option.  No Primary Care Doctor: - Call Health Connect at  [phone] - they can help you locate a primary care doctor that  accepts your insurance, provides certain services, etc. - Physician Referral Service- [phone]  Chronic Pain Problems: Organization         Address  Phone   Notes  [facility]  [phone] Patients need to be referred by their primary care doctor.   Medication Assistance: Organization         Address  Phone   Notes  [facility] [address] [place], [place] [address] [phone] --Must be a resident of [facility] [place] -- Must have NO insurance coverage whatsoever (no Medicaid/ Medicare, etc.) -- The pt. MUST have a primary care doctor that directs their care regularly and follows them in the community   [facility]  [phone]   [employer]  [phone]    Agencies that provide inexpensive medical care: Organization         Address  Phone   Notes  [facility]  [phone]   [facility]    [phone]   [facility] [address] [place], [place] [address] [phone]   [facility] [address], [place] [phone]   [facility]    [phone]   [facility]    [phone]   [facility]  [address], [place] Phone:  [phone], Fax:  [phone] Hours of Operation:  am - 6 pm, M-F.  Also accepts Medicaid/Medicare and self-pay.  °[facility] ° [address], [place] Phone: [phone], Fax: [phone]. Hours of Operation:  8:30 am - 5:30 pm, M-F.  Also accepts Medicaid and self-pay.  °[facility] [place] [address], [place] Phone: [phone]   °[facility] [address], [place], [place] [phone], Ext. 123 Mondays & Thursdays: 7-9 AM.  First 15 patients are seen on a first come, first serve basis. °  ° °Medicaid-accepting [place] Providers: ° °Organization         Address  Phone   Notes  °[facility] [address], [place] [phone] Also accepts self-pay patients.  °[facility] [address],  [place] ° [phone]) [phone]   °[facility] [address], [place] [phone]) [phone]   °[facility] [address] [place] [address], [place] [phone]) [phone]   °Veita Bland [address], [place]  ° [phone] Only accepts  Access Medicaid patients after they have their name applied to their card.  ° °Self-Pay (no insurance) in [place]: ° °Organization         Address  Phone   Notes  °Sickle Cell Patients, [facility] [address], [place] [phone]) [phone]   °[facility] [address], [place] [phone]) [phone]   °[facility] ° [address], [place] [phone]) [phone]   °[facility]/Dr. Osei-Bonsu ° [address] [place] [address], [place] or [address], [place] [phone] Phone number for both [place] and [place] locations is the same.  °[facility] [address], [place] [phone]) [phone]   °[facility] [address] [place] [address], [place] or [address] [phone]) [phone] °([phone]) [phone]   °[facility] [address], [place] [phone]) [phone], phone; [phone], fax Sees patients 1st and 3rd Saturday of every month.  Must not qualify for public or private insurance (i.e. Medicaid, Medicare, [place] Health Choice, Veterans' Benefits) • Household income should be no more than 200% of the poverty level •The clinic cannot treat you if you are pregnant or think you are pregnant • Sexually transmitted diseases are not treated at the clinic.  ° ° °Dental Care: °Organization         Address  Phone  Notes  °[facility] [address], [place] [phone]) [phone] Accepts children up to age 21 who are enrolled in Medicaid or [place] Health Choice; pregnant women with a Medicaid card; and children who have applied for Medicaid or [place] Health  Choice, but were declined, whose parents can pay a reduced fee at time of service.  °[facility] [place]  [address], [place] [phone]) [phone] Accepts children up to age 21 who are enrolled in Medicaid or [place] Health Choice; pregnant women with a Medicaid card; and children who have applied for Medicaid or [place] Health Choice, but were declined, whose parents can pay a reduced fee at time of service.  °[facility] ° [address],  ([phone]) [phone] Patients are seen by appointment only. Walk-ins are not accepted. [facility] will see patients 18 years of   age and older. °Monday - Tuesday (8am-5pm) °Most Wednesdays (8:30-5pm) °$30 per visit, cash only  °[facility] ° [address], [place] [phone]) [phone] Patients are seen by appointment only. Walk-ins are not accepted. [facility] will see patients 18 years of age and older. °One Wednesday Evening (Monthly: Volunteer Based).  $30 per visit, cash only  °[facility]  [phone] for adults; Children under age 4, call Graduate Pediatric Dentistry at [phone]. Children aged 4-14, please call [phone] to request a pediatric application. ° Dental services are provided in all areas of dental care including fillings, crowns and bridges, complete and partial dentures, implants, gum treatment, root canals, and extractions. Preventive care is also provided. Treatment is provided to both adults and children. °Patients are selected via a lottery and there is often a waiting list. °  °[facility] [address], °[place] ° ([phone]) [phone] [email or web address] °  °[facility] [address], [place], [place] ([phone], Ext. 123 Second and Fourth Thursday of each month, opens at 6:30 AM; Clinic ends at 9 AM.  Patients are seen on a first-come first-served basis, and a limited number are seen during each  clinic.  ° °[facility] ° [address], [place], [place] [phone]) [phone]   Eligibility Requirements °You must have lived in [place], [place], or [place] counties for at least the last three months. °  You cannot be eligible for state or federal sponsored healthcare insurance, including Veterans Administration, Medicaid, or Medicare. °  You generally cannot be eligible for healthcare insurance through your employer.  °  How to apply: °Eligibility screenings are held every Tuesday and Wednesday afternoon from 1:00 pm until 4:00 pm. You do not need an appointment for the interview!  °[facility] [address], [place], [place] [phone]   °[facility]  [phone]   °[facility]  [phone]   °[facility]  336-570-6415   ° °Behavioral Health Resources in the Community: °Intensive Outpatient Programs °Organization         Address  Phone  Notes  °[facility] [address], [place], [place] [phone]   °[facility] [address], [place], [place] [phone]   °[facility] [address], [place], [place] ° 336-882-2125   °[facility] [address],  °[place], [place] 1-800-853-5163 or 336-641-4981   °Substance Abuse Resources °Organization         Address  Phone  Notes  °[facility]  336-882-2125   °[facility]  336-784-9470   °[facility]  336-285-9073   °[facility]  336-845-3988   °Residential & Outpatient Substance Abuse Program  1-800-659-3381   °Psychological Services °Organization         Address  Phone  Notes  °[facility]  336- [phone]   °[facility]  336- 378-7881   °[facility] [address], [place] 1-800-853-5163 or 336-641-4981   ° °Mobile Crisis Teams °Organization         Address  Phone  Notes  °[facility], Mobile  Crisis Care Unit  1-877-626-1772   °Assertive °[facility] ° [address] [place], [place] 336-834-9664   °Sharon DeEsch [address] °[place] 336-554-5454   ° °Self-Help/Support Groups °Organization           Address  Phone             Notes  [place] [facility] [place] - variety of support groups  [place] Call for more information  Narcotics Anonymous (NA), [facility] [address], [employer] [place]  2 meetings at this location   Special educational needs teacher         Address  Phone  Notes  [facility] [place],    [place]  1-343-086-1950   [facility]  [address] [place] [address], [place], [place]   [place], [place] [phone] Admissions: 8am-3pm M-F  [facility] [place] [address],    [place], [place] [phone]   [facility] [address] [place], [place], [place]   [facility] [address]  [place], [place]   [facility] - Intensive Outpatient [address], [place], [place]   [facility] ([place].,  [place], [place] 1-301-806-4704 or [phone]   [facility] ([facility] [address] [place], [place] Accepts Medicaid  [facility] [place] [address]  [place] [phone]) [phone] Substance Abuse/Addiction Treatment   [facility]         Address  Phone  Notes  [facility]  [phone]   Domenic Schwab, PhD [address] [place], [place]   (774)[phone] or [phone]   [place], [place] [phone]) [phone]   [facility] [address], [place], [place] [phone] Insurance/Medicaid/sponsorship through [facility] [address]                                    [place], [place] [phone] [place] [address], [place] [phone]    Dr. Adele Schilder  [phone]   [facility] Dept. 1) [address], [place] 2) [place] 3)  [address], [place] [phone]  [phone]   [place] [phone] or [phone] (After Hours)

## 2015-07-16 NOTE — ED Notes (Signed)
Pt arrived via EMS, c/o N/V. She c/o cramping in top of abdomen, intermittent spasms. Pt had 1 emesis episode in route to ED.  Had taken Phenergan 30 mins. Prior to EMS arrival. Per pt, LMP beginning of November. BP: 162/100 P: 100 R: 20   SPo2: 97  CBG:118

## 2015-07-16 NOTE — ED Notes (Signed)
Awake. Verbally responsive. A/O x4. Resp even and unlabored. No audible adventitious breath sounds noted. ABC's intact. IV infusing NS at 999ml/hr without difficulty. 

## 2015-07-16 NOTE — ED Notes (Signed)
Pt given ginger ale.

## 2015-07-16 NOTE — ED Notes (Signed)
Bed: [id number] Expected date:  Expected time:  Means of arrival:  Comments: EMS 39yo F N/V/D

## 2015-07-16 NOTE — ED Provider Notes (Signed)
CSN: [id number]     Arrival date & time 07/16/15  0612 History   First MD Initiated Contact with Patient 07/16/15 [phone]     Chief Complaint  Patient presents with  . Emesis    (Consider location/radiation/quality/duration/timing/severity/associated sxs/prior Treatment) The history is provided by the patient. No language interpreter was used.   Beth Holloway is a 39 year old female with a history of hypertension who presents via EMS for nausea and vomiting that began 7 hours ago after eating frozen cheese sticks from [employer]. She also states that she ate a sandwich and was unsure about the bread and if it was stale. She vomited once in route with EMS was given Phenergan prior to arrival. She reports abdominal pain with intermittent spasms. Her last bowel movement was one hour ago and without blood.  She denies any fever, chills, diarrhea, hematemesis, dysuria, hematuria, or urinary frequency. Her last menstrual period was at the beginning of November. She denies any smoking or alcohol use.  Past Medical History  Diagnosis Date  . Hypertension    No past surgical history on file. Family History  Problem Relation Age of Onset  . Hypertension Mother   . Diabetes Father   . Hypertension Father    Social History  Substance Use Topics  . Smoking status: Never Smoker   . Smokeless tobacco: Not on file  . Alcohol Use: No   OB History    Gravida Para Term Preterm AB TAB SAB Ectopic Multiple Living       Review of Systems  Constitutional: Negative for fever.  Gastrointestinal: Positive for nausea, vomiting and abdominal pain. Negative for diarrhea, constipation and blood in stool.  All other systems reviewed and are negative.     Allergies  Codeine  Home Medications   Prior to Admission medications   Medication Sig Start Date End Date Taking? Authorizing Provider  acetaminophen (TYLENOL 8 HOUR) 650 MG CR tablet Take 1 tablet (650 mg total) by mouth  every 8 (eight) hours as needed for pain. 07/16/15   Gloriann Riede Patel-Mills, PA-C  ondansetron (ZOFRAN) 4 MG tablet Take 1 tablet (4 mg total) by mouth every 6 (six) hours. 07/16/15   Kelsa Jaworowski Patel-Mills, PA-C   BP 145/82 mmHg  Pulse 93  Temp(Src) 98.8 F (37.1 C) (Oral)  Resp 18  Ht  (1.651 m)  Wt 89.359 kg  BMI 32.78 kg/m2  SpO2 99%  LMP 06/19/2015 (Approximate) Physical Exam  Constitutional: She is oriented to person, place, and time. She appears well-developed and well-nourished.  HENT:  Head: Normocephalic and atraumatic.  Eyes: Conjunctivae are normal.  Neck: Normal range of motion. Neck supple.  Cardiovascular: Normal rate, regular rhythm and normal heart sounds.   Pulmonary/Chest: Effort normal and breath sounds normal. No respiratory distress. She has no wheezes. She has no rales.  Lungs clear to auscultation bilaterally.  Abdominal: Soft. She exhibits no distension. There is tenderness in the right upper quadrant and epigastric area. There is no rebound, no guarding and no CVA tenderness.    Obese. Epigastric and right upper quadrant tenderness to palpation. No guarding or rebound. No CVA tenderness. No abdominal distention.  Musculoskeletal: Normal range of motion.  Neurological: She is alert and oriented to person, place, and time.  Skin: Skin is warm and dry.  Nursing note and vitals reviewed.   ED Course  Procedures (including critical care time) Labs Review Labs Reviewed  CBC WITH DIFFERENTIAL/PLATELET -  Abnormal; Notable for the following:    Neutro Abs 9.4 (*)    Lymphs Abs 0.5 (*)    All other components within normal limits  COMPREHENSIVE METABOLIC PANEL - Abnormal; Notable for the following:    Glucose, Bld 122 (*)    AST 14 (*)    All other components within normal limits  URINALYSIS, ROUTINE W REFLEX MICROSCOPIC (NOT AT [facility]) - Abnormal; Notable for the following:    APPearance CLOUDY (*)    Leukocytes, UA SMALL (*)    All other components within  normal limits  URINE MICROSCOPIC-ADD ON - Abnormal; Notable for the following:    Squamous Epithelial / LPF 6-30 (*)    Bacteria, UA FEW (*)    All other components within normal limits  LIPASE, BLOOD  POC URINE PREG, ED    Imaging Review No results found. I have personally reviewed and evaluated these images and lab results as part of my medical decision-making.   EKG Interpretation None      MDM   Final diagnoses:  Non-intractable vomiting with nausea, vomiting of unspecified type  Upper abdominal pain   Patient presents for N/V and cramping intermittent abdominal pain that began 7 hours ago.  Her vital signs are stable and she is well-appearing. She was given fluids due to vomiting. Her labs are unremarkable. Recheck: Patient is feeling better after Zofran and fluids. I do not suspect cholecystitis, pancreatitis, bowel obstruction, diverticulitis, appendicitis. She is not pregnant and does not have a UTI. This is most likely gastritis. Patient is tolerating by mouth fluids. She has had no episodes of vomiting while in the ED.  Patient was given prescription for Zofran and Tylenol. I discussed return precautions with the patient. I also discussed follow-up and she verbally agrees with the plan.    Catha GosselinHanna Patel-Mills, PA-C 07/16/15 1558  Dione Boozeavid Glick, MD 07/16/15 [phone]

## 2015-07-16 NOTE — ED Notes (Signed)
Awake. Verbally responsive. A/O x4. Resp even and unlabored. No audible adventitious breath sounds noted. ABC's intact.  

## 2015-07-17 ENCOUNTER — Encounter (HOSPITAL_COMMUNITY): Payer: Self-pay | Admitting: Emergency Medicine

## 2015-09-08 ENCOUNTER — Encounter (HOSPITAL_COMMUNITY): Payer: Self-pay | Admitting: *Deleted

## 2015-09-08 ENCOUNTER — Emergency Department (HOSPITAL_COMMUNITY)
Admission: EM | Admit: 2015-09-08 | Discharge: 2015-09-08 | Disposition: A | Discharge status: Home / Self Care | Payer: Medicaid Other | Source: Home / Self Care | Attending: Internal Medicine | Admitting: Internal Medicine

## 2015-09-08 DIAGNOSIS — A609 Anogenital herpesviral infection, unspecified: Secondary | ICD-10-CM

## 2015-09-08 MED ORDER — ACYCLOVIR 400 MG PO TABS: 400.0000 mg | ORAL_TABLET | Freq: Three times a day (TID) | ORAL | Status: AC

## 2015-09-08 MED ORDER — CLOTRIMAZOLE-BETAMETHASONE 1-0.05 % EX CREA: TOPICAL_CREAM | CUTANEOUS | Status: DC

## 2015-09-08 NOTE — Discharge Instructions (Signed)
Prescriptions for acyclovir (anti viral medicine) and lotrisone cream (skin soothing) were sent to the [facility] on [address].  Recheck if not starting to improve in several days.  Genital Herpes Genital herpes is a common sexually transmitted infection (STI) that is caused by a virus. The virus is spread from person to person through sexual contact. Infection can cause itching, blisters, and sores in the genital area or rectal area. This is called an outbreak. It affects both men and women. Genital herpes is particularly concerning for pregnant women because the virus can be passed to the baby during delivery and cause serious problems. Genital herpes is also a concern for people with a weakened defense (immune) system. Symptoms of genital herpes may last several days and then go away. However, the virus remains in your body, so you may have more outbreaks of symptoms in the future. The time between outbreaks varies and can be months or years. CAUSES Genital herpes is caused by a virus called herpes simplex virus (HSV) type 2 or HSV type 1. These viruses are contagious and are most often spread through sexual contact with an infected person. Sexual contact includes vaginal, anal, and oral sex. RISK FACTORS Risk factors for genital herpes include:  Being sexually active with multiple partners.  Having unprotected sex. SIGNS AND SYMPTOMS Symptoms may include:  Pain and itching in the genital area or rectal area.  Small red bumps that turn into blisters and then turn into sores.  Flu-like symptoms, including:  Fever.  Body aches.  Painful urination.  Vaginal discharge. DIAGNOSIS Genital herpes may be diagnosed by:  Physical exam.  Blood test.  Fluid culture test from an open sore. TREATMENT There is no cure for genital herpes. Oral antiviral medicines may be used to speed up healing and to help prevent the return of symptoms. These medicines can also help to reduce the spread of the  virus to sexual partners. HOME CARE INSTRUCTIONS  Keep the affected areas dry and clean.  Take medicines only as directed by your health care provider.  Do not have sexual contact during active infections. Genital herpes is contagious.  Practice safe sex. Latex condoms and female condoms may help to prevent the spread of the herpes virus.  Avoid rubbing or touching the blisters and sores. If you do touch the blister or sores:  Wash your hands thoroughly.  Do not touch your eyes afterward.  If you become pregnant, tell your health care provider if you have had genital herpes.  Keep all follow-up visits as directed by your health care provider. This is important. PREVENTION  Use condoms. Although anyone can contract genital herpes during sexual contact even with the use of a condom, a condom can provide some protection.  Avoid having multiple sexual partners.  Talk to your sexual partner about any symptoms and past history that either of you may have.  Get tested before you have sex. Ask your partner to do the same.  Recognize the symptoms of genital herpes. Do not have sexual contact if you notice these symptoms. SEEK MEDICAL CARE IF:  Your symptoms are not improving with medicine.  Your symptoms return.  You have new symptoms.  You have a fever.  You have abdominal pain.  You have redness, swelling, or pain in your eye. MAKE SURE YOU:  Understand these instructions.  Will watch your condition.  Will get help right away if you are not doing well or get worse.   This information is not intended  to replace advice given to you by your health care provider. Make sure you discuss any questions you have with your health care provider.   Document Released: 08/01/2000 Document Revised: 08/25/2014 Document Reviewed: 12/20/2013 Elsevier Interactive Patient Education [employer]
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
## 2015-09-08 NOTE — ED Notes (Signed)
Pt  Reports  Burning  /   Irritation  / itching  In  Vaginal  Area   With  Sensation  Of  Pressure        She  denys  Discharge  Or  Urinary  Symptoms     Pt  Also  Reports  Discomfort  In  Rectal  Area

## 2015-10-05 ENCOUNTER — Emergency Department (HOSPITAL_COMMUNITY)
Admission: EM | Admit: 2015-10-05 | Discharge: 2015-10-05 | Disposition: A | Discharge status: Home / Self Care | Payer: Medicaid Other | Source: Home / Self Care | Attending: Family Medicine | Admitting: Family Medicine

## 2015-10-05 ENCOUNTER — Encounter (HOSPITAL_COMMUNITY): Payer: Self-pay

## 2015-10-05 DIAGNOSIS — R1030 Lower abdominal pain, unspecified: Secondary | ICD-10-CM
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
## 2015-10-05 NOTE — Discharge Instructions (Signed)
Increase your prilosec to 40 mg each day  See your doctor Monday

## 2015-10-05 NOTE — ED Notes (Signed)
Bed: [facility]08 Expected date:  Expected time:  Means of arrival:  Comments: 

## 2015-10-05 NOTE — ED Notes (Signed)
Patient has been having abdomen pain for several months Hurts in middle and lower part of stomach, multiple bowel movements No acute distress

## 2015-11-26 ENCOUNTER — Other Ambulatory Visit: Payer: Self-pay | Admitting: Physician Assistant

## 2015-11-26 DIAGNOSIS — R110 Nausea: Secondary | ICD-10-CM

## 2015-11-26 DIAGNOSIS — R1084 Generalized abdominal pain: Secondary | ICD-10-CM

## 2015-11-26 DIAGNOSIS — R197 Diarrhea, unspecified: Secondary | ICD-10-CM

## 2015-12-03 ENCOUNTER — Ambulatory Visit
Admission: RE | Admit: 2015-12-03 | Discharge: 2015-12-03 | Disposition: A | Discharge status: Home / Self Care | Payer: Medicaid Other | Source: Ambulatory Visit | Attending: Physician Assistant | Admitting: Physician Assistant

## 2015-12-03 DIAGNOSIS — R110 Nausea: Secondary | ICD-10-CM

## 2015-12-03 DIAGNOSIS — R197 Diarrhea, unspecified: Secondary | ICD-10-CM

## 2015-12-03 DIAGNOSIS — R1084 Generalized abdominal pain: Secondary | ICD-10-CM

## 2017-01-04 ENCOUNTER — Encounter (HOSPITAL_COMMUNITY): Payer: Self-pay | Admitting: Emergency Medicine

## 2017-01-04 ENCOUNTER — Emergency Department (HOSPITAL_COMMUNITY)
Admission: EM | Admit: 2017-01-04 | Discharge: 2017-01-04 | Disposition: A | Discharge status: Home / Self Care | Payer: Medicaid Other | Attending: Emergency Medicine | Admitting: Emergency Medicine

## 2017-01-04 DIAGNOSIS — I10 Essential (primary) hypertension: Secondary | ICD-10-CM | POA: Insufficient documentation

## 2017-01-04 DIAGNOSIS — Z79899 Other long term (current) drug therapy: Secondary | ICD-10-CM | POA: Insufficient documentation

## 2017-01-04 DIAGNOSIS — J069 Acute upper respiratory infection, unspecified: Secondary | ICD-10-CM

## 2017-01-04 MED ORDER — GUAIFENESIN-DM 100-10 MG/5ML PO SYRP
5.0000 mL | ORAL_SOLUTION | Freq: Three times a day (TID) | ORAL | 0 refills | Status: DC | PRN
Start: 2017-01-04 — End: 2017-05-22

## 2017-01-04 NOTE — ED Notes (Signed)
Declined W/C at D/C and was escorted to lobby by RN. 
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
## 2017-01-04 NOTE — ED Triage Notes (Signed)
Cough cold wheezing , sore throat and runny nose x 4 days and congestion

## 2017-01-13 ENCOUNTER — Encounter (HOSPITAL_COMMUNITY): Payer: Self-pay | Admitting: Emergency Medicine

## 2017-01-13 ENCOUNTER — Emergency Department (HOSPITAL_COMMUNITY)
Admission: EM | Admit: 2017-01-13 | Discharge: 2017-01-13 | Disposition: A | Discharge status: Home / Self Care | Payer: Self-pay | Attending: Emergency Medicine | Admitting: Emergency Medicine

## 2017-01-13 DIAGNOSIS — Z79899 Other long term (current) drug therapy: Secondary | ICD-10-CM | POA: Insufficient documentation

## 2017-01-13 DIAGNOSIS — J0100 Acute maxillary sinusitis, unspecified: Secondary | ICD-10-CM | POA: Insufficient documentation

## 2017-01-13 DIAGNOSIS — I10 Essential (primary) hypertension: Secondary | ICD-10-CM | POA: Insufficient documentation

## 2017-01-13 MED ORDER — AMOXICILLIN-POT CLAVULANATE 875-125 MG PO TABS: 1.0000 | ORAL_TABLET | Freq: Two times a day (BID) | ORAL | 0 refills | Status: DC

## 2017-01-13 MED ORDER — BENZONATATE 100 MG PO CAPS: 200.0000 mg | ORAL_CAPSULE | Freq: Two times a day (BID) | ORAL | 0 refills | Status: DC | PRN

## 2017-01-13 NOTE — Discharge Instructions (Signed)
Take your antibiotic as prescribed until completed. You may take your other medications as needed for cough and continue using your nose spray at home for your congestion. Please follow up with a primary care provider from the Resource Guide provided below in 1 week as needed. Please return to the Emergency Department if symptoms worsen or new onset of fever, facial swelling, difficulty breathing, coughing up blood, chest pain, vomiting, neck stiffness.
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
## 2017-01-13 NOTE — ED Triage Notes (Signed)
Patient reports she was seen last week and diagnosed with URI. Reports productive cough, congestion, and right ear pain have not resolved. Denies chest pain, SOB, N/V/D, and abdominal pain. Ambulatory to triage.

## 2017-05-22 ENCOUNTER — Encounter (HOSPITAL_COMMUNITY): Payer: Self-pay | Admitting: Emergency Medicine

## 2017-05-22 ENCOUNTER — Emergency Department (HOSPITAL_COMMUNITY)
Admission: EM | Admit: 2017-05-22 | Discharge: 2017-05-22 | Disposition: A | Discharge status: Home / Self Care | Payer: Self-pay | Attending: Emergency Medicine | Admitting: Emergency Medicine

## 2017-05-22 DIAGNOSIS — I10 Essential (primary) hypertension: Secondary | ICD-10-CM | POA: Insufficient documentation

## 2017-05-22 DIAGNOSIS — Z79899 Other long term (current) drug therapy: Secondary | ICD-10-CM | POA: Insufficient documentation

## 2017-05-22 DIAGNOSIS — R21 Rash and other nonspecific skin eruption: Secondary | ICD-10-CM | POA: Insufficient documentation

## 2017-05-22 MED ORDER — DIPHENHYDRAMINE HCL 25 MG PO TABS: 50.0000 mg | ORAL_TABLET | ORAL | 0 refills | Status: DC | PRN

## 2017-05-22 MED ORDER — PREDNISONE 20 MG PO TABS
ORAL_TABLET | ORAL | 0 refills | Status: DC
Start: 2017-05-22 — End: 2018-09-07

## 2017-05-22 MED ORDER — PERMETHRIN 5 % EX CREA: TOPICAL_CREAM | CUTANEOUS | 0 refills | Status: DC

## 2017-05-22 NOTE — ED Triage Notes (Signed)
Per pt, states rash that started on abdomin over a week ago-states it now between legs and under arms-no relief with Benedrayl

## 2017-05-22 NOTE — Discharge Instructions (Signed)
Your rash is likely an allergic skin reaction.  Discontinue the new washing detergent.  Take prednisone and benadryl as prescribe.  Apply Permethrin cream from neck down to the rest of the body and leave it on for 8 hrs, wash it off completely.  Use it once.
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
## 2017-12-14 ENCOUNTER — Other Ambulatory Visit: Payer: Self-pay | Admitting: Obstetrics and Gynecology

## 2017-12-14 DIAGNOSIS — Z1231 Encounter for screening mammogram for malignant neoplasm of breast: Secondary | ICD-10-CM

## 2018-01-14 ENCOUNTER — Encounter (HOSPITAL_COMMUNITY): Payer: Self-pay

## 2018-01-14 ENCOUNTER — Ambulatory Visit
Admission: RE | Admit: 2018-01-14 | Discharge: 2018-01-14 | Disposition: A | Discharge status: Home / Self Care | Payer: No Typology Code available for payment source | Source: Ambulatory Visit | Attending: Obstetrics and Gynecology | Admitting: Obstetrics and Gynecology

## 2018-01-14 ENCOUNTER — Encounter (INDEPENDENT_AMBULATORY_CARE_PROVIDER_SITE_OTHER): Payer: Self-pay

## 2018-01-14 ENCOUNTER — Ambulatory Visit (HOSPITAL_COMMUNITY)
Admission: RE | Admit: 2018-01-14 | Discharge: 2018-01-14 | Disposition: A | Discharge status: Home / Self Care | Payer: Self-pay | Source: Ambulatory Visit | Attending: Obstetrics and Gynecology | Admitting: Obstetrics and Gynecology

## 2018-01-14 VITALS — BP 120/82 | Ht 65.0 in | Wt 202.2 lb

## 2018-01-14 DIAGNOSIS — Z1239 Encounter for other screening for malignant neoplasm of breast: Secondary | ICD-10-CM

## 2018-01-14 DIAGNOSIS — Z1231 Encounter for screening mammogram for malignant neoplasm of breast: Secondary | ICD-10-CM

## 2018-01-14 NOTE — Patient Instructions (Signed)
Explained breast self awareness with Petra Kuba. Patient did not need a Pap smear today due to last Pap smear was around November 2018 per patient. Let her know [facility] will cover Pap smears every 3 years unless has a history of abnormal Pap smears. Referred patient to the [facility] for a screening mammogram. Appointment scheduled for Thursday, Jan 14, 2018 at 1610. Let patient know the Breast Center will follow up with her within the next couple weeks with results of mammogram by letter or phone. Hurshel Keys Harrelson verbalized understanding.  Teylor Wolven, Kathaleen Maser, RN 3:44 PM

## 2018-01-14 NOTE — Progress Notes (Signed)
No complaints today.   Pap Smear: Pap smear not completed today. Last Pap smear was around November 2018 at [facility] and normal per patient. Per patient has no history of an abnormal Pap smear. No Pap smear results are in Epic.  Physical exam: Breasts Right breast slightly larger than left breast that per patient is normal for her. No skin abnormalities bilateral breasts. No nipple retraction right breast. Left nipple slightly inverted that per patient is normal for her. No nipple discharge bilateral breasts. No lymphadenopathy. No lumps palpated bilateral breasts. No complaints of pain or tenderness on exam. Referred patient to the [facility] for a screening mammogram. Appointment scheduled for Thursday, Jan 14, 2018 at 1610.        Pelvic/Bimanual No Pap smear completed today since last Pap smear was around November 2018 per patient. Pap smear not indicated per [facility] guidelines.   Smoking History: Patient has never smoked.  Patient Navigation: Patient education provided. Access to services provided for patient through [facility] program.   Breast and Cervical Cancer Risk Assessment: Patient has a family history of a paternal aunt having breast cancer. Patient has no known genetic mutations or history of radiation treatment to the chest before age 29. Patient has no history of cervical dysplasia, immunocompromised, or DES exposure in-utero. Patient has a 5-year risk of breast cancer at 0.6% and a lifetime risk at 10.2%.

## 2018-01-18 ENCOUNTER — Other Ambulatory Visit: Payer: Self-pay | Admitting: Obstetrics and Gynecology

## 2018-01-18 DIAGNOSIS — R928 Other abnormal and inconclusive findings on diagnostic imaging of breast: Secondary | ICD-10-CM

## 2018-01-25 ENCOUNTER — Ambulatory Visit
Admission: RE | Admit: 2018-01-25 | Discharge: 2018-01-25 | Disposition: A | Discharge status: Home / Self Care | Payer: No Typology Code available for payment source | Source: Ambulatory Visit | Attending: Obstetrics and Gynecology | Admitting: Obstetrics and Gynecology

## 2018-01-25 DIAGNOSIS — R928 Other abnormal and inconclusive findings on diagnostic imaging of breast: Secondary | ICD-10-CM

## 2018-02-03 ENCOUNTER — Encounter (HOSPITAL_COMMUNITY): Payer: Self-pay | Admitting: *Deleted

## 2018-05-25 ENCOUNTER — Emergency Department (HOSPITAL_COMMUNITY)
Admission: EM | Admit: 2018-05-25 | Discharge: 2018-05-25 | Disposition: A | Discharge status: Home / Self Care | Payer: No Typology Code available for payment source | Attending: Emergency Medicine | Admitting: Emergency Medicine

## 2018-05-25 ENCOUNTER — Other Ambulatory Visit: Payer: Self-pay

## 2018-05-25 ENCOUNTER — Encounter (HOSPITAL_COMMUNITY): Payer: Self-pay

## 2018-05-25 DIAGNOSIS — M778 Other enthesopathies, not elsewhere classified: Secondary | ICD-10-CM | POA: Insufficient documentation

## 2018-05-25 DIAGNOSIS — I10 Essential (primary) hypertension: Secondary | ICD-10-CM | POA: Insufficient documentation

## 2018-05-25 MED ORDER — METHOCARBAMOL 500 MG PO TABS: 500.0000 mg | ORAL_TABLET | Freq: Two times a day (BID) | ORAL | 0 refills | Status: DC

## 2018-05-25 NOTE — ED Notes (Signed)
ED Provider at bedside. 
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
## 2018-05-25 NOTE — Discharge Instructions (Signed)
ED for worsening symptoms, injuries or falls, numbness in arms or legs, red hot or tender joint.

## 2018-05-25 NOTE — ED Triage Notes (Signed)
Pt reports R elbow pain x1 month. She reports that she lifts boxes at work. She has tried naproxen, advil, and a brace without adequate relief.

## 2018-05-25 NOTE — ED Notes (Signed)
Pt asking if we have braces for tendonitis.  Called [facility] Tech who stated that we don't have those specific braces.

## 2018-09-07 ENCOUNTER — Encounter (HOSPITAL_COMMUNITY): Payer: Self-pay

## 2018-09-07 ENCOUNTER — Other Ambulatory Visit: Payer: Self-pay

## 2018-09-07 ENCOUNTER — Emergency Department (HOSPITAL_COMMUNITY)
Admission: EM | Admit: 2018-09-07 | Discharge: 2018-09-07 | Disposition: A | Discharge status: Home / Self Care | Payer: Self-pay | Attending: Emergency Medicine | Admitting: Emergency Medicine

## 2018-09-07 ENCOUNTER — Emergency Department (HOSPITAL_COMMUNITY): Payer: Self-pay

## 2018-09-07 DIAGNOSIS — R1084 Generalized abdominal pain: Secondary | ICD-10-CM

## 2018-09-07 DIAGNOSIS — R1013 Epigastric pain: Secondary | ICD-10-CM | POA: Insufficient documentation

## 2018-09-07 DIAGNOSIS — I10 Essential (primary) hypertension: Secondary | ICD-10-CM | POA: Insufficient documentation

## 2018-09-07 DIAGNOSIS — Z79899 Other long term (current) drug therapy: Secondary | ICD-10-CM | POA: Insufficient documentation

## 2018-09-07 LAB — CBC
HCT: 45.5 % (ref 36.0–46.0)
Hemoglobin: 13.8 g/dL (ref 12.0–15.0)
MCH: 26.8 pg (ref 26.0–34.0)
MCHC: 30.3 g/dL (ref 30.0–36.0)
MCV: 88.3 fL (ref 80.0–100.0)
NRBC: 0 % (ref 0.0–0.2)
PLATELETS: 284 10*3/uL (ref 150–400)
RBC: 5.15 MIL/uL — AB (ref 3.87–5.11)
RDW: 13.2 % (ref 11.5–15.5)
WBC: 6.5 10*3/uL (ref 4.0–10.5)

## 2018-09-07 LAB — COMPREHENSIVE METABOLIC PANEL
ALK PHOS: 56 U/L (ref 38–126)
ALT: 14 U/L (ref 0–44)
ANION GAP: 8 (ref 5–15)
AST: 17 U/L (ref 15–41)
Albumin: 4.7 g/dL (ref 3.5–5.0)
BILIRUBIN TOTAL: 0.4 mg/dL (ref 0.3–1.2)
BUN: 17 mg/dL (ref 6–20)
CALCIUM: 9.2 mg/dL (ref 8.9–10.3)
CO2: 24 mmol/L (ref 22–32)
CREATININE: 0.66 mg/dL (ref 0.44–1.00)
Chloride: 107 mmol/L (ref 98–111)
GFR calc non Af Amer: >60 mL/min (ref >60)
Glucose, Bld: 95 mg/dL (ref 70–99)
Potassium: 3.7 mmol/L (ref 3.5–5.1)
Sodium: 139 mmol/L (ref 135–145)
TOTAL PROTEIN: 7.5 g/dL (ref 6.5–8.1)

## 2018-09-07 LAB — TYPE AND SCREEN
ABO/RH(D): A POS
Antibody Screen: NEGATIVE

## 2018-09-07 LAB — URINALYSIS, ROUTINE W REFLEX MICROSCOPIC
BILIRUBIN URINE: NEGATIVE
Glucose, UA: NEGATIVE mg/dL
KETONES UR: NEGATIVE mg/dL
Nitrite: NEGATIVE
Protein, ur: NEGATIVE mg/dL
Specific Gravity, Urine: 1.02 (ref 1.005–1.030)
pH: 5 (ref 5.0–8.0)

## 2018-09-07 LAB — PREGNANCY, URINE: Preg Test, Ur: NEGATIVE

## 2018-09-07 LAB — POC OCCULT BLOOD, ED: Fecal Occult Bld: NEGATIVE

## 2018-09-07 LAB — LIPASE, BLOOD: LIPASE: 44 U/L (ref 11–51)

## 2018-09-07 MED ORDER — IOPAMIDOL (ISOVUE-300) INJECTION 61%
100.0000 mL | Freq: Once | INTRAVENOUS | Status: AC | PRN
  Administered 2018-09-07: 100 mL via INTRAVENOUS

## 2018-09-07 MED ORDER — LIDOCAINE VISCOUS HCL 2 % MT SOLN
15.0000 mL | Freq: Once | OROMUCOSAL | Status: DC
  Filled 2018-09-07: qty 15

## 2018-09-07 MED ORDER — IOPAMIDOL (ISOVUE-300) INJECTION 61%
INTRAVENOUS | Status: AC
  Filled 2018-09-07: qty 100

## 2018-09-07 MED ORDER — FAMOTIDINE 20 MG PO TABS: 20.0000 mg | ORAL_TABLET | Freq: Two times a day (BID) | ORAL | 0 refills | Status: DC

## 2018-09-07 MED ORDER — ONDANSETRON HCL 4 MG/2ML IJ SOLN
4.0000 mg | Freq: Once | INTRAMUSCULAR | Status: DC
Start: 2018-09-07 — End: 2018-09-08
  Filled 2018-09-07: qty 2

## 2018-09-07 MED ORDER — SODIUM CHLORIDE 0.9 % IV BOLUS
1000.0000 mL | Freq: Once | INTRAVENOUS | Status: AC
  Administered 2018-09-07: 1000 mL via INTRAVENOUS

## 2018-09-07 MED ORDER — ALUM & MAG HYDROXIDE-SIMETH 200-200-20 MG/5ML PO SUSP
30.0000 mL | Freq: Once | ORAL | Status: DC
  Filled 2018-09-07: qty 30

## 2018-09-07 MED ORDER — PROMETHAZINE HCL 25 MG PO TABS: 25.0000 mg | ORAL_TABLET | Freq: Four times a day (QID) | ORAL | 0 refills | Status: DC | PRN

## 2018-09-07 MED ORDER — ONDANSETRON HCL 4 MG PO TABS: 4.0000 mg | ORAL_TABLET | Freq: Four times a day (QID) | ORAL | 0 refills | Status: DC

## 2018-09-07 MED ORDER — SODIUM CHLORIDE (PF) 0.9 % IJ SOLN
INTRAMUSCULAR | Status: AC
  Filled 2018-09-07: qty 50

## 2018-09-07 NOTE — ED Notes (Signed)
Patient transported to CT 

## 2018-09-07 NOTE — ED Notes (Signed)
Pt stated "I need to leave around 8:30. 

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
## 2018-09-07 NOTE — ED Notes (Addendum)
Pt stated, "I may not be able to stay to finish everything. I need to be somewhere at 2030". Will notify provider.

## 2018-09-07 NOTE — Discharge Instructions (Signed)
Return for any problem.  Follow-up with your regular care providers as instructed.  Please follow-up with your primary GI as instructed.

## 2018-09-07 NOTE — ED Notes (Signed)
Pt back from radiology. Alert and ambulatory

## 2018-09-07 NOTE — ED Notes (Addendum)
Provider notified of pt wanting to leave by 2030.

## 2018-09-07 NOTE — ED Notes (Signed)
Pt requesting nausea medication to go home with. MD notified.

## 2018-09-07 NOTE — ED Notes (Signed)
While being discharged, pt stated that she does not have insurance and that is the reason she came to the ED rather than following up with GI. Pt provided information on how to answer questions regarding insurance in order to follow up with GI.

## 2018-09-07 NOTE — ED Notes (Signed)
Pt verbalized discharge instructions and follow up care. Alert and ambulatory  

## 2018-09-07 NOTE — ED Notes (Signed)
POC occult obtained

## 2018-09-07 NOTE — ED Triage Notes (Signed)
Patient arrived via POV. Patient is AOx4 and ambulatory. Patient chief complaint is abdominal pain and nausea that began 6-7 days ago and has been very persistent with OTC medications. Patient stated she had an extremely dark almost black in color stool last night and one this morning.

## 2018-09-08 LAB — ABO/RH: ABO/RH(D): A POS

## 2019-08-19 DIAGNOSIS — N200 Calculus of kidney: Secondary | ICD-10-CM

## 2019-08-19 HISTORY — DX: Calculus of kidney: N20.0

## 2019-12-21 ENCOUNTER — Ambulatory Visit (HOSPITAL_COMMUNITY)
Admission: EM | Admit: 2019-12-21 | Discharge: 2019-12-21 | Disposition: A | Discharge status: Home / Self Care | Payer: Self-pay | Attending: Family Medicine | Admitting: Family Medicine

## 2019-12-21 ENCOUNTER — Other Ambulatory Visit: Payer: Self-pay

## 2019-12-21 DIAGNOSIS — N939 Abnormal uterine and vaginal bleeding, unspecified: Secondary | ICD-10-CM | POA: Insufficient documentation

## 2019-12-21 LAB — CBC
HCT: 40.2 % (ref 36.0–46.0)
Hemoglobin: 12.6 g/dL (ref 12.0–15.0)
MCH: 28.1 pg (ref 26.0–34.0)
MCHC: 31.3 g/dL (ref 30.0–36.0)
MCV: 89.7 fL (ref 80.0–100.0)
Platelets: 282 10*3/uL (ref 150–400)
RBC: 4.48 MIL/uL (ref 3.87–5.11)
RDW: 12.7 % (ref 11.5–15.5)
WBC: 7.6 10*3/uL (ref 4.0–10.5)
nRBC: 0 % (ref 0.0–0.2)

## 2019-12-21 MED ORDER — MEGESTROL ACETATE 40 MG PO TABS: 40.0000 mg | ORAL_TABLET | Freq: Two times a day (BID) | ORAL | 0 refills | Status: AC
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
## 2019-12-21 NOTE — Discharge Instructions (Signed)
I am checking your hemoglobin, I will call if this is abnormal Please begin taking Megace twice daily until bleeding stops Follow-up with OB/GYN-use contacts below  If any symptoms persisting or worsening, developing abdominal pain, increased dizziness or lightheadedness please follow-up in emergency room

## 2019-12-21 NOTE — ED Triage Notes (Signed)
Pt c/o vaginal bleeding x 3 weeks. Last normal cycle in February

## 2020-01-13 ENCOUNTER — Ambulatory Visit: Payer: Self-pay | Admitting: Family Medicine

## 2020-02-16 ENCOUNTER — Ambulatory Visit: Payer: Self-pay | Admitting: Family Medicine

## 2020-03-26 ENCOUNTER — Other Ambulatory Visit: Payer: Self-pay

## 2020-03-26 ENCOUNTER — Emergency Department (HOSPITAL_COMMUNITY)
Admission: EM | Admit: 2020-03-26 | Discharge: 2020-03-27 | Disposition: A | Discharge status: Home / Self Care | Payer: Self-pay | Attending: Emergency Medicine | Admitting: Emergency Medicine

## 2020-03-26 ENCOUNTER — Encounter (HOSPITAL_COMMUNITY): Payer: Self-pay

## 2020-03-26 DIAGNOSIS — R531 Weakness: Secondary | ICD-10-CM | POA: Insufficient documentation

## 2020-03-26 DIAGNOSIS — Z5321 Procedure and treatment not carried out due to patient leaving prior to being seen by health care provider: Secondary | ICD-10-CM | POA: Insufficient documentation

## 2020-03-26 DIAGNOSIS — R0602 Shortness of breath: Secondary | ICD-10-CM | POA: Insufficient documentation

## 2020-03-26 DIAGNOSIS — M7918 Myalgia, other site: Secondary | ICD-10-CM | POA: Insufficient documentation

## 2020-03-26 NOTE — ED Triage Notes (Signed)
Pt complaining of Sob at night, generalized weakness, and generalized pain. Pt diagnosed on Aug 5th with covid. Pts fevers have been controlled, but coughing has not. Pt is also running out of her prednisone.

## 2020-03-27 NOTE — ED Notes (Signed)
No answer for V/S recheck x1 

## 2020-07-06 ENCOUNTER — Emergency Department (HOSPITAL_COMMUNITY)
Admission: EM | Admit: 2020-07-06 | Discharge: 2020-07-06 | Disposition: A | Discharge status: Home / Self Care | Payer: Self-pay | Attending: Emergency Medicine | Admitting: Emergency Medicine

## 2020-07-06 ENCOUNTER — Emergency Department (HOSPITAL_COMMUNITY): Payer: Self-pay

## 2020-07-06 ENCOUNTER — Encounter (HOSPITAL_COMMUNITY): Payer: Self-pay | Admitting: Emergency Medicine

## 2020-07-06 ENCOUNTER — Other Ambulatory Visit: Payer: Self-pay

## 2020-07-06 DIAGNOSIS — I10 Essential (primary) hypertension: Secondary | ICD-10-CM | POA: Insufficient documentation

## 2020-07-06 DIAGNOSIS — N200 Calculus of kidney: Secondary | ICD-10-CM

## 2020-07-06 DIAGNOSIS — R112 Nausea with vomiting, unspecified: Secondary | ICD-10-CM | POA: Insufficient documentation

## 2020-07-06 DIAGNOSIS — N132 Hydronephrosis with renal and ureteral calculous obstruction: Secondary | ICD-10-CM | POA: Insufficient documentation

## 2020-07-06 DIAGNOSIS — Z79899 Other long term (current) drug therapy: Secondary | ICD-10-CM | POA: Insufficient documentation

## 2020-07-06 LAB — URINALYSIS, ROUTINE W REFLEX MICROSCOPIC
Bacteria, UA: NONE SEEN
Bilirubin Urine: NEGATIVE
Glucose, UA: NEGATIVE mg/dL
Ketones, ur: NEGATIVE mg/dL
Leukocytes,Ua: NEGATIVE
Nitrite: NEGATIVE
Protein, ur: NEGATIVE mg/dL
RBC / HPF: >50 RBC/hpf — ABNORMAL HIGH (ref 0–5)
Specific Gravity, Urine: 1.023 (ref 1.005–1.030)
pH: 5 (ref 5.0–8.0)

## 2020-07-06 LAB — COMPREHENSIVE METABOLIC PANEL
ALT: 15 U/L (ref 0–44)
AST: 15 U/L (ref 15–41)
Albumin: 3.8 g/dL (ref 3.5–5.0)
Alkaline Phosphatase: 54 U/L (ref 38–126)
Anion gap: 10 (ref 5–15)
BUN: 19 mg/dL (ref 6–20)
CO2: 23 mmol/L (ref 22–32)
Calcium: 8.9 mg/dL (ref 8.9–10.3)
Chloride: 107 mmol/L (ref 98–111)
Creatinine, Ser: 0.76 mg/dL (ref 0.44–1.00)
GFR, Estimated: >60 mL/min (ref >60)
Glucose, Bld: 169 mg/dL — ABNORMAL HIGH (ref 70–99)
Potassium: 3.7 mmol/L (ref 3.5–5.1)
Sodium: 140 mmol/L (ref 135–145)
Total Bilirubin: 0.3 mg/dL (ref 0.3–1.2)
Total Protein: 6.3 g/dL — ABNORMAL LOW (ref 6.5–8.1)

## 2020-07-06 LAB — I-STAT BETA HCG BLOOD, ED (MC, WL, AP ONLY): I-stat hCG, quantitative: <5 m[IU]/mL (ref <5)

## 2020-07-06 LAB — CBC WITH DIFFERENTIAL/PLATELET
Abs Immature Granulocytes: 0.06 10*3/uL (ref 0.00–0.07)
Basophils Absolute: 0 10*3/uL (ref 0.0–0.1)
Basophils Relative: 0 %
Eosinophils Absolute: 0.1 10*3/uL (ref 0.0–0.5)
Eosinophils Relative: 1 %
HCT: 33.9 % — ABNORMAL LOW (ref 36.0–46.0)
Hemoglobin: 10.1 g/dL — ABNORMAL LOW (ref 12.0–15.0)
Immature Granulocytes: 1 %
Lymphocytes Relative: 10 %
Lymphs Abs: 1.2 10*3/uL (ref 0.7–4.0)
MCH: 25.8 pg — ABNORMAL LOW (ref 26.0–34.0)
MCHC: 29.8 g/dL — ABNORMAL LOW (ref 30.0–36.0)
MCV: 86.5 fL (ref 80.0–100.0)
Monocytes Absolute: 0.6 10*3/uL (ref 0.1–1.0)
Monocytes Relative: 5 %
Neutro Abs: 9.9 10*3/uL — ABNORMAL HIGH (ref 1.7–7.7)
Neutrophils Relative %: 83 %
Platelets: 249 10*3/uL (ref 150–400)
RBC: 3.92 MIL/uL (ref 3.87–5.11)
RDW: 13.5 % (ref 11.5–15.5)
WBC: 11.9 10*3/uL — ABNORMAL HIGH (ref 4.0–10.5)
nRBC: 0 % (ref 0.0–0.2)

## 2020-07-06 LAB — LIPASE, BLOOD: Lipase: 36 U/L (ref 11–51)

## 2020-07-06 MED ORDER — ONDANSETRON HCL 4 MG/2ML IJ SOLN
4.0000 mg | Freq: Once | INTRAMUSCULAR | Status: AC
  Administered 2020-07-06: 4 mg via INTRAVENOUS
  Filled 2020-07-06: qty 2

## 2020-07-06 MED ORDER — OXYCODONE-ACETAMINOPHEN 5-325 MG PO TABS
1.0000 | ORAL_TABLET | ORAL | 0 refills | Status: DC | PRN
Start: 2020-07-06 — End: 2021-11-14

## 2020-07-06 MED ORDER — KETOROLAC TROMETHAMINE 30 MG/ML IJ SOLN
30.0000 mg | Freq: Once | INTRAMUSCULAR | Status: AC
  Administered 2020-07-06: 30 mg via INTRAVENOUS
  Filled 2020-07-06: qty 1

## 2020-07-06 MED ORDER — TAMSULOSIN HCL 0.4 MG PO CAPS: 0.4000 mg | ORAL_CAPSULE | Freq: Every day | ORAL | 0 refills | Status: DC

## 2020-07-06 MED ORDER — ONDANSETRON 4 MG PO TBDP: 4.0000 mg | ORAL_TABLET | Freq: Four times a day (QID) | ORAL | 0 refills | Status: DC | PRN

## 2020-07-06 MED ORDER — MORPHINE SULFATE (PF) 4 MG/ML IV SOLN
4.0000 mg | Freq: Once | INTRAVENOUS | Status: AC
  Administered 2020-07-06: 4 mg via INTRAVENOUS
  Filled 2020-07-06: qty 1

## 2020-07-06 MED ORDER — IBUPROFEN 800 MG PO TABS: 800.0000 mg | ORAL_TABLET | Freq: Three times a day (TID) | ORAL | 0 refills | Status: DC | PRN

## 2020-07-06 MED ORDER — PROMETHAZINE HCL 25 MG/ML IJ SOLN
25.0000 mg | Freq: Once | INTRAMUSCULAR | Status: AC
  Administered 2020-07-06: 25 mg via INTRAMUSCULAR
  Filled 2020-07-06: qty 1
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
## 2020-07-06 NOTE — ED Notes (Signed)
This NT fluid challenged the pt with ice water. Pt had no issues drinking water.

## 2020-07-06 NOTE — ED Triage Notes (Signed)
Patient from home, having sudden onset of right, sharp flank pain, radiating to the groin that woke her from sleep.  No history of kidney stones.  Patient having nausea and vomiting with dry heaving.  Patient was given 50 mcg of fentanyl and 4mg  of Zofran.  Patient then had a transient bradycardia, went from 90 to 42 and hypotension of 71/53.  Patient has recovered from event with 250cc NS bolus.  Patient is CAOx4.  Continues with flank pain.

## 2020-08-23 ENCOUNTER — Other Ambulatory Visit (HOSPITAL_COMMUNITY): Payer: Self-pay | Admitting: Urology

## 2020-08-23 ENCOUNTER — Other Ambulatory Visit: Payer: Self-pay | Admitting: Urology

## 2020-08-23 DIAGNOSIS — N201 Calculus of ureter: Secondary | ICD-10-CM

## 2020-08-29 ENCOUNTER — Ambulatory Visit (HOSPITAL_COMMUNITY): Payer: Self-pay

## 2020-08-29 ENCOUNTER — Encounter (HOSPITAL_COMMUNITY): Payer: Self-pay

## 2021-04-10 ENCOUNTER — Encounter (HOSPITAL_COMMUNITY): Payer: Self-pay

## 2021-04-10 ENCOUNTER — Emergency Department (HOSPITAL_COMMUNITY)
Admission: EM | Admit: 2021-04-10 | Discharge: 2021-04-10 | Disposition: A | Discharge status: Home / Self Care | Payer: Self-pay | Attending: Emergency Medicine | Admitting: Emergency Medicine

## 2021-04-10 ENCOUNTER — Other Ambulatory Visit: Payer: Self-pay

## 2021-04-10 DIAGNOSIS — Z28310 Unvaccinated for covid-19: Secondary | ICD-10-CM | POA: Insufficient documentation

## 2021-04-10 DIAGNOSIS — I10 Essential (primary) hypertension: Secondary | ICD-10-CM | POA: Insufficient documentation

## 2021-04-10 DIAGNOSIS — Z8616 Personal history of COVID-19: Secondary | ICD-10-CM | POA: Insufficient documentation

## 2021-04-10 DIAGNOSIS — U071 COVID-19: Secondary | ICD-10-CM | POA: Insufficient documentation

## 2021-04-10 LAB — RESP PANEL BY RT-PCR (FLU A&B, COVID) ARPGX2
Influenza A by PCR: NEGATIVE
Influenza B by PCR: NEGATIVE
SARS Coronavirus 2 by RT PCR: POSITIVE — AB

## 2021-04-10 LAB — GROUP A STREP BY PCR: Group A Strep by PCR: NOT DETECTED

## 2021-04-10 MED ORDER — PREDNISONE 10 MG (21) PO TBPK: ORAL_TABLET | Freq: Every day | ORAL | 0 refills | Status: DC

## 2021-04-10 MED ORDER — ONDANSETRON 4 MG PO TBDP: 4.0000 mg | ORAL_TABLET | Freq: Three times a day (TID) | ORAL | 0 refills | Status: DC | PRN

## 2021-04-10 NOTE — Discharge Instructions (Addendum)
I am prescribing you a medication called Zofran.  This is a disintegrating tablet you can use up to 3 times a day for management of your nausea and vomiting.  Please only take this as prescribed.  Please only take this if you are experiencing nausea and vomiting that you cannot control.  I am prescribing you a strong steroid medication called prednisone.  Please only take this as prescribed.  Please take it early in the morning, as this medication can be stimulating and make it difficult to sleep at night.  Please make sure you quarantine based on current CDC guidelines.  If you develop any new or worsening symptoms please come back to the emergency department.  It was a pleasure to meet you.

## 2021-04-10 NOTE — ED Triage Notes (Signed)
Pt arrived via POV, c/o headache and sore throat. Requesting covid test. States no known sick contacts. Febrile at home, took tylenol just prior to arrival.
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
## 2021-09-03 ENCOUNTER — Encounter (HOSPITAL_COMMUNITY): Payer: Self-pay

## 2021-09-03 ENCOUNTER — Emergency Department (HOSPITAL_COMMUNITY): Payer: BC Managed Care – PPO

## 2021-09-03 ENCOUNTER — Other Ambulatory Visit: Payer: Self-pay

## 2021-09-03 ENCOUNTER — Emergency Department (HOSPITAL_COMMUNITY)
Admission: EM | Admit: 2021-09-03 | Discharge: 2021-09-04 | Disposition: A | Discharge status: Home / Self Care | Payer: BC Managed Care – PPO | Source: Home / Self Care | Attending: Emergency Medicine | Admitting: Emergency Medicine

## 2021-09-03 ENCOUNTER — Emergency Department (HOSPITAL_COMMUNITY)
Admission: EM | Admit: 2021-09-03 | Discharge: 2021-09-03 | Disposition: A | Discharge status: Home / Self Care | Payer: BC Managed Care – PPO | Attending: Emergency Medicine | Admitting: Emergency Medicine

## 2021-09-03 DIAGNOSIS — K5900 Constipation, unspecified: Secondary | ICD-10-CM | POA: Insufficient documentation

## 2021-09-03 DIAGNOSIS — Z79899 Other long term (current) drug therapy: Secondary | ICD-10-CM | POA: Insufficient documentation

## 2021-09-03 DIAGNOSIS — I10 Essential (primary) hypertension: Secondary | ICD-10-CM | POA: Insufficient documentation

## 2021-09-03 DIAGNOSIS — R319 Hematuria, unspecified: Secondary | ICD-10-CM | POA: Diagnosis not present

## 2021-09-03 DIAGNOSIS — M545 Low back pain, unspecified: Secondary | ICD-10-CM | POA: Insufficient documentation

## 2021-09-03 DIAGNOSIS — M5459 Other low back pain: Secondary | ICD-10-CM | POA: Diagnosis not present

## 2021-09-03 DIAGNOSIS — R109 Unspecified abdominal pain: Secondary | ICD-10-CM | POA: Diagnosis not present

## 2021-09-03 DIAGNOSIS — R112 Nausea with vomiting, unspecified: Secondary | ICD-10-CM | POA: Insufficient documentation

## 2021-09-03 LAB — URINALYSIS, ROUTINE W REFLEX MICROSCOPIC
Bilirubin Urine: NEGATIVE
Glucose, UA: NEGATIVE mg/dL
Ketones, ur: NEGATIVE mg/dL
Leukocytes,Ua: NEGATIVE
Nitrite: NEGATIVE
Protein, ur: NEGATIVE mg/dL
RBC / HPF: >50 RBC/hpf — ABNORMAL HIGH (ref 0–5)
Specific Gravity, Urine: 1.019 (ref 1.005–1.030)
pH: 6 (ref 5.0–8.0)

## 2021-09-03 LAB — CBC WITH DIFFERENTIAL/PLATELET
Abs Immature Granulocytes: 0.06 10*3/uL (ref 0.00–0.07)
Basophils Absolute: 0 10*3/uL (ref 0.0–0.1)
Basophils Relative: 0 %
Eosinophils Absolute: 0.1 10*3/uL (ref 0.0–0.5)
Eosinophils Relative: 2 %
HCT: 37.6 % (ref 36.0–46.0)
Hemoglobin: 11.5 g/dL — ABNORMAL LOW (ref 12.0–15.0)
Immature Granulocytes: 1 %
Lymphocytes Relative: 20 %
Lymphs Abs: 1.7 10*3/uL (ref 0.7–4.0)
MCH: 26.9 pg (ref 26.0–34.0)
MCHC: 30.6 g/dL (ref 30.0–36.0)
MCV: 87.9 fL (ref 80.0–100.0)
Monocytes Absolute: 0.6 10*3/uL (ref 0.1–1.0)
Monocytes Relative: 7 %
Neutro Abs: 5.8 10*3/uL (ref 1.7–7.7)
Neutrophils Relative %: 70 %
Platelets: 247 10*3/uL (ref 150–400)
RBC: 4.28 MIL/uL (ref 3.87–5.11)
RDW: 13 % (ref 11.5–15.5)
WBC: 8.2 10*3/uL (ref 4.0–10.5)
nRBC: 0 % (ref 0.0–0.2)

## 2021-09-03 LAB — COMPREHENSIVE METABOLIC PANEL
ALT: 16 U/L (ref 0–44)
AST: 14 U/L — ABNORMAL LOW (ref 15–41)
Albumin: 4 g/dL (ref 3.5–5.0)
Alkaline Phosphatase: 65 U/L (ref 38–126)
Anion gap: 6 (ref 5–15)
BUN: 22 mg/dL — ABNORMAL HIGH (ref 6–20)
CO2: 26 mmol/L (ref 22–32)
Calcium: 9 mg/dL (ref 8.9–10.3)
Chloride: 105 mmol/L (ref 98–111)
Creatinine, Ser: 0.53 mg/dL (ref 0.44–1.00)
GFR, Estimated: >60 mL/min (ref >60)
Glucose, Bld: 122 mg/dL — ABNORMAL HIGH (ref 70–99)
Potassium: 4.2 mmol/L (ref 3.5–5.1)
Sodium: 137 mmol/L (ref 135–145)
Total Bilirubin: 0.5 mg/dL (ref 0.3–1.2)
Total Protein: 6.9 g/dL (ref 6.5–8.1)

## 2021-09-03 LAB — I-STAT BETA HCG BLOOD, ED (MC, WL, AP ONLY): I-stat hCG, quantitative: <5 m[IU]/mL (ref <5)

## 2021-09-03 MED ORDER — ONDANSETRON 4 MG PO TBDP
4.0000 mg | ORAL_TABLET | Freq: Once | ORAL | Status: AC
  Administered 2021-09-03: 4 mg via ORAL
  Filled 2021-09-03: qty 1

## 2021-09-03 MED ORDER — IBUPROFEN 200 MG PO TABS
600.0000 mg | ORAL_TABLET | Freq: Once | ORAL | Status: AC
  Administered 2021-09-03: 600 mg via ORAL
  Filled 2021-09-03: qty 3

## 2021-09-03 NOTE — ED Triage Notes (Signed)
Pt continues to have left sided pain and nausea after being seen this morning.

## 2021-09-03 NOTE — ED Triage Notes (Signed)
Pt reports with left flank pain that radiates to her abdomen since 0130. Pt has a hx of kidney stones.

## 2021-09-03 NOTE — ED Provider Triage Note (Signed)
Emergency Medicine Provider Triage Evaluation Note  Beth Holloway , a 46 y.o. female  was evaluated in triage.  Pt complains of continued left-sided pain.  Patient was seen in the ED earlier today for same.  She had CT renal stone study done at that time which did not show any things besides some moderate stool burden.  She was discharged home with advised to take over-the-counter medications for her constipation.  She states that this feels very similar to when she had kidney stones in the past.  She states that she was unable to take any medication at home due to the amount of pain she was in.  She has not tried over-the-counter ibuprofen or Tylenol.  She does report she has been somewhat nauseated as well.  She does mention that she was able to take Dulcolax earlier today however still has not had a bowel movement.  Her last normal bowel movement was yesterday.  Review of Systems  Positive: + left flank pain, nausea Negative: - fevers, urinary symptoms  Physical Exam  There were no vitals taken for this visit. Gen:   Awake, no distress   Resp:  Normal effort  MSK:   Moves extremities without difficulty  Other:  Mild left flank TTP  Medical Decision Making  Medically screening exam initiated at 10:29 PM.  Appropriate orders placed.  Beth Holloway was informed that the remainder of the evaluation will be completed by another provider, this initial triage assessment does not replace that evaluation, and the importance of remaining in the ED until their evaluation is complete.     Eustaquio Maize, PA-C 09/03/21 2234

## 2021-09-03 NOTE — ED Provider Notes (Signed)
Beth Holloway   CSN: [id number] Arrival date & time: 09/03/21  [id number]     History  Chief Complaint  Patient presents with   Flank Pain    Beth Holloway is a 46 y.o. female.  46 year old female presents with left-sided flank pain x24 hours.  Pain characterizes colicky and similar to her prior renal colic.  Denies any urinary changes.  No fever or vomiting.  No GYN complaints.  Last kidney stone was over a year ago.  Patient used home albeit with mild relief      Home Medications Prior to Admission medications   Medication Sig Start Date End Date Taking? Authorizing Provider  famotidine (PEPCID) 20 MG tablet Take 1 tablet (20 mg total) by mouth 2 (two) times daily. 09/07/18   Valarie Merino, MD  ibuprofen (ADVIL) 800 MG tablet Take 1 tablet (800 mg total) by mouth every 8 (eight) hours as needed for mild pain. 07/06/20   Ward, Delice Bison, DO  lamoTRIgine (LAMICTAL) 150 MG tablet Take 150 mg by mouth daily.    [provider]  lisinopril-hydrochlorothiazide (PRINZIDE,ZESTORETIC) 20-12.5 MG per tablet Take 1 tablet by mouth daily.    [provider]  ondansetron (ZOFRAN ODT) 4 MG disintegrating tablet Take 1 tablet (4 mg total) by mouth every 8 (eight) hours as needed for nausea or vomiting. 04/10/21   Rayna Sexton, PA-C  ondansetron (ZOFRAN) 4 MG tablet Take 1 tablet (4 mg total) by mouth every 6 (six) hours. 09/07/18   Valarie Merino, MD  oxyCODONE-acetaminophen (PERCOCET/ROXICET) 5-325 MG tablet Take 1 tablet by mouth every 4 (four) hours as needed. 07/06/20   Ward, Delice Bison, DO  predniSONE (STERAPRED UNI-PAK 21 TAB) 10 MG (21) TBPK tablet Take by mouth daily. Take 6 tabs by mouth daily  for 2 days, then 5 tabs for 2 days, then 4 tabs for 2 days, then 3 tabs for 2 days, 2 tabs for 2 days, then 1 tab by mouth daily for 2 days 04/10/21   Rayna Sexton, PA-C  promethazine (PHENERGAN) 25 MG tablet Take 1 tablet (25 mg  total) by mouth every 6 (six) hours as needed for nausea or vomiting. 09/07/18   Valarie Merino, MD  tamsulosin (FLOMAX) 0.4 MG CAPS capsule Take 1 capsule (0.4 mg total) by mouth daily. Take until stone passes 07/06/20   Ward, Delice Bison, DO      Allergies    Codeine    Review of Systems   Review of Systems  All other systems reviewed and are negative.  Physical Exam Updated Vital Signs BP (!) 144/86 (BP Location: Right Arm)    Pulse 82    Temp 97.9 F (36.6 C) (Oral)    Resp 17    SpO2 98%  Physical Exam Vitals and nursing Holloway reviewed.  Constitutional:      General: She is not in acute distress.    Appearance: Normal appearance. She is well-developed. She is not toxic-appearing.  HENT:     Head: Normocephalic and atraumatic.  Eyes:     General: Lids are normal.     Conjunctiva/sclera: Conjunctivae normal.     Pupils: Pupils are equal, round, and reactive to light.  Neck:     Thyroid: No thyroid mass.     Trachea: No tracheal deviation.  Cardiovascular:     Rate and Rhythm: Normal rate and regular rhythm.     Heart sounds: Normal heart sounds. No murmur heard.  No gallop.  Pulmonary:     Effort: Pulmonary effort is normal. No respiratory distress.     Breath sounds: Normal breath sounds. No stridor. No decreased breath sounds, wheezing, rhonchi or rales.  Abdominal:     General: There is no distension.     Palpations: Abdomen is soft.     Tenderness: There is no abdominal tenderness. There is no rebound.  Musculoskeletal:        General: No tenderness. Normal range of motion.     Cervical back: Normal range of motion and neck supple.  Skin:    General: Skin is warm and dry.     Findings: No abrasion or rash.  Neurological:     Mental Status: She is alert and oriented to person, place, and time. Mental status is at baseline.     GCS: GCS eye subscore is 4. GCS verbal subscore is 5. GCS motor subscore is 6.     Cranial Nerves: No cranial nerve deficit.     Sensory:  No sensory deficit.     Motor: Motor function is intact.  Psychiatric:        Attention and Perception: Attention normal.        Speech: Speech normal.        Behavior: Behavior normal.    ED Results / Procedures / Treatments   Labs (all labs ordered are listed, but only abnormal results are displayed) Labs Reviewed  CBC WITH DIFFERENTIAL/PLATELET - Abnormal; Notable for the following components:      Result Value   Hemoglobin 11.5 (*)    All other components within normal limits  COMPREHENSIVE METABOLIC PANEL - Abnormal; Notable for the following components:   Glucose, Bld 122 (*)    BUN 22 (*)    AST 14 (*)    All other components within normal limits  URINALYSIS, ROUTINE W REFLEX MICROSCOPIC  I-STAT BETA HCG BLOOD, ED ([facility], [facility], [facility] ONLY)    EKG None  Radiology No results found.  Procedures Procedures    Medications Ordered in ED Medications - No data to display  ED Course/ Medical Decision Making/ A&P                           Medical Decision Making Amount and/or Complexity of Data Reviewed Labs: ordered. Radiology: ordered.   Patient offered pain medication here and states he feels better.  Urinalysis does show hematuria but patient denies any dysuria.  Work is reassuring here.  Concern for possible renal colic as she has a history of this and CT shows no evidence of this.  Patient is comfortable at this time.  CT of abdomen did show constipation.  Patient instructed on bowel regimen.  Will discharge home        Final Clinical Impression(s) / ED Diagnoses Final diagnoses:  None    Rx / DC Orders ED Discharge Orders     None         Lacretia Leigh, MD 09/03/21 1025

## 2021-09-04 ENCOUNTER — Encounter (HOSPITAL_COMMUNITY): Payer: Self-pay | Admitting: Student

## 2021-09-04 MED ORDER — POLYETHYLENE GLYCOL 3350 17 G PO PACK: 17.0000 g | PACK | Freq: Every day | ORAL | Status: DC

## 2021-09-04 MED ORDER — LIDOCAINE 5 % EX PTCH: 1.0000 | MEDICATED_PATCH | Freq: Every day | CUTANEOUS | 0 refills | Status: DC | PRN

## 2021-09-04 MED ORDER — POLYETHYLENE GLYCOL 3350 17 G PO PACK: 17.0000 g | PACK | Freq: Every day | ORAL | 0 refills | Status: DC | PRN

## 2021-09-04 MED ORDER — LIDOCAINE 5 % EX PTCH
2.0000 | MEDICATED_PATCH | CUTANEOUS | Status: DC
  Administered 2021-09-04: 2 via TRANSDERMAL
  Filled 2021-09-04: qty 2

## 2021-09-04 MED ORDER — ONDANSETRON 4 MG PO TBDP: 4.0000 mg | ORAL_TABLET | Freq: Three times a day (TID) | ORAL | 0 refills | Status: DC | PRN

## 2021-09-04 MED ORDER — POLYETHYLENE GLYCOL 3350 17 G PO PACK
17.0000 g | PACK | Freq: Every day | ORAL | Status: DC
  Administered 2021-09-04: 17 g via ORAL
  Filled 2021-09-04: qty 1

## 2021-09-04 NOTE — ED Provider Notes (Signed)
[facility] DEPT Provider Note   CSN: [id number] Arrival date & time: 09/03/21  2018     History  Chief Complaint  Patient presents with   Constipation    Beth Holloway is a 46 y.o. female with a hx of hypertension, anxiety, PTSD, & hypercholesterolemia who returns to the ED with complaints of left-sided back pain over the past 24 hours.  Patient states the pain is to the left of her back and wraps around into her abdomen, it is fairly constant, worse with certain position changes, no alleviating factors.  When she got home from her prior ER visit the pain seemed to worsen and she subsequently had nausea and a couple episodes of vomiting.  She became concerned therefore she returned to the emergency department.  She took a Dulcolax but has not had a bowel movement since her ED visit, she has not taken any other medicines.  Her last bowel movement was the day prior and she is passing gas. She received zofran & motrin in triage which has now seemed to help her sxs. She denies fever, hematemesis, melena, diarrhea, dysuria, vaginal bleeding, vaginal discharge, or visible hematuria.  She states she was told that she had blood in her urine earlier today and that she was constipated.  She additionally denies numbness, tingling, weakness, incontinence, retention, IVDU, or prior cancer.  HPI     Home Medications Prior to Admission medications   Medication Sig Start Date End Date Taking? Authorizing Provider  famotidine (PEPCID) 20 MG tablet Take 1 tablet (20 mg total) by mouth 2 (two) times daily. 09/07/18   Valarie Merino, MD  ibuprofen (ADVIL) 800 MG tablet Take 1 tablet (800 mg total) by mouth every 8 (eight) hours as needed for mild pain. 07/06/20   Ward, Delice Bison, DO  lamoTRIgine (LAMICTAL) 150 MG tablet Take 150 mg by mouth daily.    [provider]  lisinopril-hydrochlorothiazide (PRINZIDE,ZESTORETIC) 20-12.5 MG per tablet Take 1 tablet by mouth  daily.    [provider]  ondansetron (ZOFRAN ODT) 4 MG disintegrating tablet Take 1 tablet (4 mg total) by mouth every 8 (eight) hours as needed for nausea or vomiting. 04/10/21   Rayna Sexton, PA-C  ondansetron (ZOFRAN) 4 MG tablet Take 1 tablet (4 mg total) by mouth every 6 (six) hours. 09/07/18   Valarie Merino, MD  oxyCODONE-acetaminophen (PERCOCET/ROXICET) 5-325 MG tablet Take 1 tablet by mouth every 4 (four) hours as needed. 07/06/20   Ward, Delice Bison, DO  predniSONE (STERAPRED UNI-PAK 21 TAB) 10 MG (21) TBPK tablet Take by mouth daily. Take 6 tabs by mouth daily  for 2 days, then 5 tabs for 2 days, then 4 tabs for 2 days, then 3 tabs for 2 days, 2 tabs for 2 days, then 1 tab by mouth daily for 2 days 04/10/21   Rayna Sexton, PA-C  promethazine (PHENERGAN) 25 MG tablet Take 1 tablet (25 mg total) by mouth every 6 (six) hours as needed for nausea or vomiting. 09/07/18   Valarie Merino, MD  tamsulosin (FLOMAX) 0.4 MG CAPS capsule Take 1 capsule (0.4 mg total) by mouth daily. Take until stone passes 07/06/20   Ward, Delice Bison, DO      Allergies    Codeine    Review of Systems   Review of Systems  Constitutional:  Negative for chills and fever.  Respiratory:  Negative for shortness of breath.   Cardiovascular:  Negative for chest pain.  Gastrointestinal:  Positive for abdominal pain, constipation, nausea and vomiting.  Genitourinary:  Negative for dysuria and hematuria.  Musculoskeletal:  Positive for back pain.  Neurological:  Negative for weakness and numbness.  All other systems reviewed and are negative.  Physical Exam Updated Vital Signs BP (!) 149/88 (BP Location: Left Arm)    Pulse 85    Temp 98.2 F (36.8 C) (Oral)    Resp 16    SpO2 99%  Physical Exam Vitals and nursing note reviewed.  Constitutional:      General: She is not in acute distress.    Appearance: She is well-developed. She is not toxic-appearing.  HENT:     Head: Normocephalic and  atraumatic.  Eyes:     General:        Right eye: No discharge.        Left eye: No discharge.     Conjunctiva/sclera: Conjunctivae normal.  Cardiovascular:     Rate and Rhythm: Normal rate and regular rhythm.     Pulses:          Dorsalis pedis pulses are 2+ on the right side and 2+ on the left side.  Pulmonary:     Effort: Pulmonary effort is normal. No respiratory distress.     Breath sounds: Normal breath sounds. No wheezing, rhonchi or rales.  Abdominal:     General: There is no distension.     Palpations: Abdomen is soft.     Tenderness: There is no abdominal tenderness. There is no guarding or rebound.  Musculoskeletal:     Cervical back: Normal range of motion and neck supple. No spinous process tenderness or muscular tenderness.     Comments: No obvious deformity, appreciable swelling, erythema, ecchymosis, significant open wounds, or increased warmth.  Extremities: Normal ROM. Nontender.  Back: No point/focal vertebral tenderness, no palpable step off or crepitus.  Left lower thoracic and lumbar paraspinal muscle tenderness to palpation without overlying skin changes/rashes.  Skin:    General: Skin is warm and dry.     Findings: No rash.  Neurological:     Mental Status: She is alert.     Deep Tendon Reflexes:     Reflex Scores:      Patellar reflexes are 2+ on the right side and 2+ on the left side.    Comments: Clear speech.   Psychiatric:        Behavior: Behavior normal.    ED Results / Procedures / Treatments   Labs (all labs ordered are listed, but only abnormal results are displayed) Labs Reviewed - No data to display  EKG None  Radiology CT Renal Stone Study  Result Date: 09/03/2021 CLINICAL DATA:  Flank pain. Kidney stone suspected. Left flank pain since 1:30 a.m. Microhematuria. EXAM: CT ABDOMEN AND PELVIS WITHOUT CONTRAST TECHNIQUE: Multidetector CT imaging of the abdomen and pelvis was performed following the standard protocol without IV contrast.  RADIATION DOSE REDUCTION: This exam was performed according to the departmental dose-optimization program which includes automated exposure control, adjustment of the mA and/or kV according to patient size and/or use of iterative reconstruction technique. COMPARISON:  CT abdomen and pelvis without contrast 07/06/2020 FINDINGS: Lower chest: Unremarkable. Heart size is normal. No pericardial effusion. Lack of intra-articular fluid limits evaluation of the abdominal and pelvic organ parenchyma. The following findings are made within this limitation. Hepatobiliary: Smooth liver contours. No gross liver lesion is identified. The gallbladder is grossly unremarkable. Pancreas: Unremarkable. No pancreatic ductal dilatation or surrounding inflammatory changes. Spleen: Normal  in size without focal abnormality. A small splenule is again seen just posterior to the spleen. Adrenals/Urinary Tract: Normal adrenals. Resolution of the prior right ureteropelvic junction stone with upstream mild ureterectasis and hydronephrosis. No stone is seen within either ureter or either kidney. No hydronephrosis within either kidney. The urinary bladder is unremarkable. Within the limitations of lack of IV contrast, no contour deforming renal mass. Stomach/Bowel: Moderate stool is seen throughout the colon. No bowel wall thickening. The terminal ileum is unremarkable. The appendix is within normal limits. No dilated loops of bowel to indicate bowel obstruction. Vascular/Lymphatic: No abdominal aortic aneurysm. No enlarged abdominal or pelvic lymph nodes. Reproductive: The uterus is present and grossly unremarkable. No adnexal masses. Other: No abdominal wall hernia. No free air or free fluid. Musculoskeletal: Severe L5-S1 disc space narrowing with endplate sclerosis and peripheral osteophytosis, similar to prior. IMPRESSION: 1. No nephroureterolithiasis or hydronephrosis. Resolution of the prior right ureteropelvic junction stone. 2. Moderate  stool throughout the colon compatible with constipation. No bowel obstruction. Electronically Signed   By: Yvonne Kendall M.D.   On: 09/03/2021 08:41    Procedures Procedures    Medications Ordered in ED Medications  ondansetron (ZOFRAN-ODT) disintegrating tablet 4 mg (4 mg Oral Given 09/03/21 2234)  ibuprofen (ADVIL) tablet 600 mg (600 mg Oral Given 09/03/21 2234)    ED Course/ Medical Decision Making/ A&P                           Medical Decision Making Risk Prescription drug management.   Patient presents to the ED with complaints of left back/abdominal pain which has continued since her last ED visit now with episodes of vomiting.  Patient is nontoxic, resting comfortably, her vitals are notable for mildly elevated blood pressure.  She is feeling somewhat improved following zofran & motrin given in triage  Additional history obtained:  External records from outside source obtained and reviewed including ED visit earlier today-patient presented with same complaint, she had a CT renal stone study which showed no nephroureterolithiasis or hydronephrosis.  There was resolution of the prior right ureteropelvic junction stone.  She was noted to have moderate stool throughout the colon compatible with constipation.  No bowel obstruction.  Her CBC showed mild anemia similar to prior, her CMP showed no significant electrolyte derangement, renal function and LFTs are without significant abnormality, and her pregnancy test was negative.  She did have large amount of hemoglobin present on her urinalysis, however this has been present when checked the last 2 years.  No obvious UTI was noted.  ED Course:  I ordered medications including miralax & lidoderm patches for constipation & pain. Will PO challenge.   01:45: RE-EVAL: Patient tolerating PO, feeling better, she would like to go home.   Back/abdominal pain: CT w/o acute surgical process earlier today- no peritoneal signs on abdominal exam  currently- doubt acute surgical abdomen. CT w/o kidney stone earlier today. Hematuria present previously will need recheck-urology info provided, no obvious UTI to suggest pyelonephritis. No focal neuro deficits, ambulatory- doubt cord compression or cauda equina syndrome. Symmetric pulses, well appearing, doubt dissection. Some reproducibility with paraspinal muscle palpation. Potentially multifactorial pain. Patient feeling improved, tolerating PO, overall appears appropriate for discharge home with supportive care. I discussed treatment plan, need for follow-up, and return precautions with the patient. Provided opportunity for questions, patient confirmed understanding and is in agreement with plan.    Portions of this note were generated with  [employer]. Dictation errors may occur despite best attempts at proofreading.         Final Clinical Impression(s) / ED Diagnoses Final diagnoses:  Acute left-sided low back pain without sciatica  Constipation, unspecified constipation type    Rx / DC Orders ED Discharge Orders          Ordered    polyethylene glycol (MIRALAX) 17 g packet  Daily PRN        09/04/21 0148    ondansetron (ZOFRAN-ODT) 4 MG disintegrating tablet  Every 8 hours PRN        09/04/21 0148    lidocaine (LIDODERM) 5 %  Daily PRN        09/04/21 0148              Joquan Lotz, Glynda Jaeger, PA-C 09/04/21 0201    Quintella Reichert, MD 09/04/21 [phone]

## 2021-09-04 NOTE — Discharge Instructions (Addendum)
You were seen in the emergency department today for left-sided back/abdominal pain.  Your work-up from earlier today showed that you had some blood in your urine is a finding suggestive of constipation.  Please follow-up with urology for the blood in your urine.  We are sending you home with miralax to take 1-2 times per day as needed for constipation, zofran every 8 hours as needed for nausea/vomiting, and lidoderm patches for pain. Place 1 Lidoderm patch in your area most sniffing and pain once per day.  Remove and discard patch within 12 hours of application.  You can take Motrin and/or Tylenol per over-the-counter dosing to help with pain as well.  We have prescribed you new medication(s) today. Discuss the medications prescribed today with your pharmacist as they can have adverse effects and interactions with your other medicines including over the counter and prescribed medications. Seek medical evaluation if you start to experience new or abnormal symptoms after taking one of these medicines, seek care immediately if you start to experience difficulty breathing, feeling of your throat closing, facial swelling, or rash as these could be indications of a more serious allergic reaction  Please follow-up with primary care soon as possible.  Return to the emergency department for new or worsening symptoms or any other concerns.

## 2021-11-14 ENCOUNTER — Encounter: Payer: Self-pay | Admitting: Family Medicine

## 2021-11-14 ENCOUNTER — Ambulatory Visit (INDEPENDENT_AMBULATORY_CARE_PROVIDER_SITE_OTHER): Payer: BC Managed Care – PPO | Admitting: Family Medicine

## 2021-11-14 VITALS — BP 130/84 | HR 84 | Temp 98.3°F | Ht 64.5 in | Wt 226.0 lb

## 2021-11-14 DIAGNOSIS — F341 Dysthymic disorder: Secondary | ICD-10-CM

## 2021-11-14 DIAGNOSIS — I10 Essential (primary) hypertension: Secondary | ICD-10-CM

## 2021-11-14 DIAGNOSIS — M79672 Pain in left foot: Secondary | ICD-10-CM | POA: Diagnosis not present

## 2021-11-14 DIAGNOSIS — N921 Excessive and frequent menstruation with irregular cycle: Secondary | ICD-10-CM | POA: Diagnosis not present

## 2021-11-14 DIAGNOSIS — D500 Iron deficiency anemia secondary to blood loss (chronic): Secondary | ICD-10-CM | POA: Diagnosis not present

## 2021-11-14 LAB — COMPREHENSIVE METABOLIC PANEL
ALT: 13 U/L (ref 0–35)
AST: 12 U/L (ref 0–37)
Albumin: 4.4 g/dL (ref 3.5–5.2)
Alkaline Phosphatase: 63 U/L (ref 39–117)
BUN: 16 mg/dL (ref 6–23)
CO2: 26 mEq/L (ref 19–32)
Calcium: 9.4 mg/dL (ref 8.4–10.5)
Chloride: 106 mEq/L (ref 96–112)
Creatinine, Ser: 0.66 mg/dL (ref 0.40–1.20)
GFR: 106.74 mL/min (ref >60.00)
Glucose, Bld: 83 mg/dL (ref 70–99)
Potassium: 4.7 mEq/L (ref 3.5–5.1)
Sodium: 141 mEq/L (ref 135–145)
Total Bilirubin: 0.3 mg/dL (ref 0.2–1.2)
Total Protein: 6.7 g/dL (ref 6.0–8.3)

## 2021-11-14 LAB — CBC WITH DIFFERENTIAL/PLATELET
Basophils Absolute: 0 10*3/uL (ref 0.0–0.1)
Basophils Relative: 0.4 % (ref 0.0–3.0)
Eosinophils Absolute: 0.1 10*3/uL (ref 0.0–0.7)
Eosinophils Relative: 1.2 % (ref 0.0–5.0)
HCT: 35 % — ABNORMAL LOW (ref 36.0–46.0)
Hemoglobin: 11.1 g/dL — ABNORMAL LOW (ref 12.0–15.0)
Lymphocytes Relative: 23.9 % (ref 12.0–46.0)
Lymphs Abs: 1.5 10*3/uL (ref 0.7–4.0)
MCHC: 31.8 g/dL (ref 30.0–36.0)
MCV: 83.8 fl (ref 78.0–100.0)
Monocytes Absolute: 0.6 10*3/uL (ref 0.1–1.0)
Monocytes Relative: 9.2 % (ref 3.0–12.0)
Neutro Abs: 4 10*3/uL (ref 1.4–7.7)
Neutrophils Relative %: 65.3 % (ref 43.0–77.0)
Platelets: 239 10*3/uL (ref 150.0–400.0)
RBC: 4.18 Mil/uL (ref 3.87–5.11)
RDW: 14.3 % (ref 11.5–15.5)
WBC: 6.2 10*3/uL (ref 4.0–10.5)

## 2021-11-14 LAB — LIPID PANEL
Cholesterol: 191 mg/dL (ref 0–200)
HDL: 63.4 mg/dL (ref >39.00)
LDL Cholesterol: 114 mg/dL — ABNORMAL HIGH (ref 0–99)
NonHDL: 127.36
Total CHOL/HDL Ratio: 3
Triglycerides: 65 mg/dL (ref 0.0–149.0)
VLDL: 13 mg/dL (ref 0.0–40.0)

## 2021-11-14 LAB — IBC + FERRITIN
Ferritin: 5.7 ng/mL — ABNORMAL LOW (ref 10.0–291.0)
Iron: 27 ug/dL — ABNORMAL LOW (ref 42–145)
Saturation Ratios: 5.6 % — ABNORMAL LOW (ref 20.0–50.0)
TIBC: 480.2 ug/dL — ABNORMAL HIGH (ref 250.0–450.0)
Transferrin: 343 mg/dL (ref 212.0–360.0)

## 2021-11-14 LAB — VITAMIN B12: Vitamin B-12: 315 pg/mL (ref 211–911)

## 2021-11-14 LAB — TSH: TSH: 4.23 u[IU]/mL (ref 0.35–5.50)

## 2021-11-14 MED ORDER — LISINOPRIL-HYDROCHLOROTHIAZIDE 20-12.5 MG PO TABS: 1.0000 | ORAL_TABLET | Freq: Every day | ORAL | 1 refills | Status: DC

## 2021-11-14 MED ORDER — LAMOTRIGINE 150 MG PO TABS
150.0000 mg | ORAL_TABLET | Freq: Every day | ORAL | 1 refills | Status: DC
Start: 2021-11-14 — End: 2021-11-14

## 2021-11-14 MED ORDER — LAMOTRIGINE 150 MG PO TABS: 150.0000 mg | ORAL_TABLET | Freq: Every day | ORAL | 1 refills | Status: DC

## 2021-11-14 MED ORDER — LISINOPRIL-HYDROCHLOROTHIAZIDE 10-12.5 MG PO TABS: 1.0000 | ORAL_TABLET | Freq: Every day | ORAL | 1 refills | Status: DC

## 2021-11-14 NOTE — Patient Instructions (Signed)
Welcome to [facility]! It was a pleasure meeting you today. ? ?As discussed, Please schedule a 6 month follow up visit today. ? ?Referring to podiatry ? ?PLEASE NOTE: ? ?If you had any LAB tests please let us know if you have not heard back within a few days. You may see your results on MyChart before we have a chance to review them but we will give you a call once they are reviewed by [place]. If we ordered any REFERRALS today, please let us know if you have not heard from their office within the next week.  ?Let us know through MyChart if you are needing REFILLS, or have your pharmacy send [place] the request. You can also use MyChart to communicate with me or any office staff. ? ?Please try these tips to maintain a healthy lifestyle: ? ?Eat most of your calories during the day when you are active. Eliminate processed foods including packaged sweets (pies, cakes, cookies), reduce intake of potatoes, white bread, white pasta, and white rice. Look for whole grain options, oat flour or almond flour. ? ?Each meal should contain half fruits/vegetables, one quarter protein, and one quarter carbs (no bigger than a computer mouse). ? ?Cut down on sweet beverages. This includes juice, soda, and sweet tea. Also watch fruit intake, though this is a healthier sweet option, it still contains natural sugar! Limit to 3 servings daily. ? ?Drink at least 1 glass of water with each meal and aim for at least 8 glasses per day ? ?Exercise at least 150 minutes every week.   ?
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
## 2021-11-15 LAB — HEMOGLOBIN A1C: Hgb A1c MFr Bld: 5.8 % (ref 4.6–6.5)

## 2021-11-18 ENCOUNTER — Telehealth: Payer: Self-pay

## 2021-11-18 NOTE — Telephone Encounter (Signed)
Patient has called back in regard to labs.  I have read patient Dr. Myrtie Cruise response.    Please give additional info on b12 and iron.   Also, does patient need 3 month OV or 3 month lab?   Patient may have additional questions in regard.  Patient does not get off of work until 4:30pm.  Is requesting call back at that time.    Patient also states she will try to call back in at that time.

## 2021-11-18 NOTE — Telephone Encounter (Signed)
Spoke to patient and went over questions she had about labs. Patient see gyn May 18, she will see if they will recheck cbc, if not patient will call back to schedule a lab visit. ?

## 2021-11-28 ENCOUNTER — Ambulatory Visit (INDEPENDENT_AMBULATORY_CARE_PROVIDER_SITE_OTHER): Payer: BC Managed Care – PPO

## 2021-11-28 ENCOUNTER — Ambulatory Visit (INDEPENDENT_AMBULATORY_CARE_PROVIDER_SITE_OTHER): Payer: BC Managed Care – PPO | Admitting: Podiatry

## 2021-11-28 ENCOUNTER — Encounter: Payer: Self-pay | Admitting: Podiatry

## 2021-11-28 DIAGNOSIS — M7662 Achilles tendinitis, left leg: Secondary | ICD-10-CM | POA: Diagnosis not present

## 2021-11-28 DIAGNOSIS — M79672 Pain in left foot: Secondary | ICD-10-CM

## 2021-11-28 DIAGNOSIS — M7661 Achilles tendinitis, right leg: Secondary | ICD-10-CM

## 2021-11-28 DIAGNOSIS — M79671 Pain in right foot: Secondary | ICD-10-CM

## 2021-11-28 MED ORDER — DICLOFENAC SODIUM 75 MG PO TBEC: 75.0000 mg | DELAYED_RELEASE_TABLET | Freq: Two times a day (BID) | ORAL | 2 refills | Status: DC

## 2021-11-28 MED ORDER — TRIAMCINOLONE ACETONIDE 10 MG/ML IJ SUSP
10.0000 mg | Freq: Once | INTRAMUSCULAR | Status: AC
  Administered 2021-11-28: 10 mg

## 2021-11-28 NOTE — Patient Instructions (Signed)
Achilles Tendinitis  ?with Rehab ?Achilles tendinitis is a disorder of the Achilles tendon. The Achilles tendon connects the large calf muscles (Gastrocnemius and Soleus) to the heel bone (calcaneus). This tendon is sometimes called the heel cord. It is important for pushing-off and standing on your toes and is important for walking, running, or jumping. Tendinitis is often caused by overuse and repetitive microtrauma. ?SYMPTOMS ?Pain, tenderness, swelling, warmth, and redness may occur over the Achilles tendon even at rest. ?Pain with pushing off, or flexing or extending the ankle. ?Pain that is worsened after or during activity. ?CAUSES  ?Overuse sometimes seen with rapid increase in exercise programs or in sports requiring running and jumping. ?Poor physical conditioning (strength and flexibility or endurance). ?Running sports, especially training running down hills. ?Inadequate warm-up before practice or play or failure to stretch before participation. ?Injury to the tendon. ?PREVENTION  ?Warm up and stretch before practice or competition. ?Allow time for adequate rest and recovery between practices and competition. ?Keep up conditioning. ?Keep up ankle and leg flexibility. ?Improve or keep muscle strength and endurance. ?Improve cardiovascular fitness. ?Use proper technique. ?Use proper equipment (shoes, skates). ?To help prevent recurrence, taping, protective strapping, or an adhesive bandage may be recommended for several weeks after healing is complete. ?PROGNOSIS  ?Recovery may take weeks to several months to heal. ?Longer recovery is expected if symptoms have been prolonged. ?Recovery is usually quicker if the inflammation is due to a direct blow as compared with overuse or sudden strain. ?RELATED COMPLICATIONS  ?Healing time will be prolonged if the condition is not correctly treated. The injury must be given plenty of time to heal. ?Symptoms can reoccur if activity is resumed too soon. ?Untreated,  tendinitis may increase the risk of tendon rupture requiring additional time for recovery and possibly surgery. ?TREATMENT  ?The first treatment consists of rest anti-inflammatory medication, and ice to relieve the pain. ?Stretching and strengthening exercises after resolution of pain will likely help reduce the risk of recurrence. Referral to a physical therapist or athletic trainer for further evaluation and treatment may be helpful. ?A walking boot or cast may be recommended to rest the Achilles tendon. This can help break the cycle of inflammation and microtrauma. ?Arch supports (orthotics) may be prescribed or recommended by your caregiver as an adjunct to therapy and rest. ?Surgery to remove the inflamed tendon lining or degenerated tendon tissue is rarely necessary and has shown less than predictable results. ?MEDICATION  ?Nonsteroidal anti-inflammatory medications, such as aspirin and ibuprofen, may be used for pain and inflammation relief. Do not take within 7 days before surgery. Take these as directed by your caregiver. Contact your caregiver immediately if any bleeding, stomach upset, or signs of allergic reaction occur. Other minor pain relievers, such as acetaminophen, may also be used. ?Pain relievers may be prescribed as necessary by your caregiver. Do not take prescription pain medication for longer than 4 to 7 days. Use only as directed and only as much as you need. ?Cortisone injections are rarely indicated. Cortisone injections may weaken tendons and predispose to rupture. It is better to give the condition more time to heal than to use them. ?HEAT AND COLD ?Cold is used to relieve pain and reduce inflammation for acute and chronic Achilles tendinitis. Cold should be applied for 10 to 15 minutes every 2 to 3 hours for inflammation and pain and immediately after any activity that aggravates your symptoms. Use ice packs or an ice massage. ?Heat may be used before performing stretching   and  strengthening activities prescribed by your caregiver. Use a heat pack or a warm soak. ?SEEK MEDICAL CARE IF: ?Symptoms get worse or do not improve in 2 weeks despite treatment. ?New, unexplained symptoms develop. Drugs used in treatment may produce side effects. ? ?EXERCISES: ? ?RANGE OF MOTION (ROM) AND STRETCHING EXERCISES - Achilles Tendinitis  ?These exercises may help you when beginning to rehabilitate your injury. Your symptoms may resolve with or without further involvement from your physician, physical therapist or athletic trainer. While completing these exercises, remember:  ?Restoring tissue flexibility helps normal motion to return to the joints. This allows healthier, less painful movement and activity. ?An effective stretch should be held for at least 30 seconds. ?A stretch should never be painful. You should only feel a gentle lengthening or release in the stretched tissue. ? ?STRETCH  Gastroc, Standing  ?Place hands on wall. ?Extend right / left leg, keeping the front knee somewhat bent. ?Slightly point your toes inward on your back foot. ?Keeping your right / left heel on the floor and your knee straight, shift your weight toward the wall, not allowing your back to arch. ?You should feel a gentle stretch in the right / left calf. Hold this position for 10 seconds. ?Repeat 3 times. Complete this stretch 2 times per day. ? ?STRETCH  Soleus, Standing  ?Place hands on wall. ?Extend right / left leg, keeping the other knee somewhat bent. ?Slightly point your toes inward on your back foot. ?Keep your right / left heel on the floor, bend your back knee, and slightly shift your weight over the back leg so that you feel a gentle stretch deep in your back calf. ?Hold this position for 10 seconds. ?Repeat 3 times. Complete this stretch 2 times per day. ? ?STRETCH  Gastrocsoleus, Standing  ?Note: This exercise can place a lot of stress on your foot and ankle. Please complete this exercise only if specifically  instructed by your caregiver.  ?Place the ball of your right / left foot on a step, keeping your other foot firmly on the same step. ?Hold on to the wall or a rail for balance. ?Slowly lift your other foot, allowing your body weight to press your heel down over the edge of the step. ?You should feel a stretch in your right / left calf. ?Hold this position for 10 seconds. ?Repeat this exercise with a slight bend in your knee. ?Repeat 3 times. Complete this stretch 2 times per day.  ? ?STRENGTHENING EXERCISES - Achilles Tendinitis ?These exercises may help you when beginning to rehabilitate your injury. They may resolve your symptoms with or without further involvement from your physician, physical therapist or athletic trainer. While completing these exercises, remember:  ?Muscles can gain both the endurance and the strength needed for everyday activities through controlled exercises. ?Complete these exercises as instructed by your physician, physical therapist or athletic trainer. Progress the resistance and repetitions only as guided. ?You may experience muscle soreness or fatigue, but the pain or discomfort you are trying to eliminate should never worsen during these exercises. If this pain does worsen, stop and make certain you are following the directions exactly. If the pain is still present after adjustments, discontinue the exercise until you can discuss the trouble with your clinician. ? ?STRENGTH - Plantar-flexors  ?Sit with your right / left leg extended. Holding onto both ends of a rubber exercise band/tubing, loop it around the ball of your foot. Keep a slight tension in the band. ?Slowly   push your toes away from you, pointing them downward. ?Hold this position for 10 seconds. Return slowly, controlling the tension in the band/tubing. ?Repeat 3 times. Complete this exercise 2 times per day.  ? ?STRENGTH - Plantar-flexors  ?Stand with your feet shoulder width apart. Steady yourself with a wall or table  using as little support as needed. ?Keeping your weight evenly spread over the width of your feet, rise up on your toes.* ?Hold this position for 10 seconds. ?Repeat 3 times. Complete this exercise 2 times per day.  ?

## 2021-11-29 NOTE — Progress Notes (Signed)
Subjective:  ? ?Patient ID: Beth Holloway, female   DOB: 46 y.o.   MRN: [id number]  ? ?HPI ?Patient presents stating she has had Achilles kind of discomfort in the back of her heel left over right for around 3 years and intensifying over the last 6 months.  Patient states she has to work she cannot be off and she is desperate for some form of relief for this condition and does not smoke and would like to be more active ? ? ?Review of Systems  ?All other systems reviewed and are negative. ? ? ?   ?Objective:  ?Physical Exam ?Vitals and nursing note reviewed.  ?Constitutional:   ?   Appearance: She is well-developed.  ?Pulmonary:  ?   Effort: Pulmonary effort is normal.  ?Musculoskeletal:     ?   General: Normal range of motion.  ?Skin: ?   General: Skin is warm.  ?Neurological:  ?   Mental Status: She is alert.  ?  ?Neurovascular status found to be intact muscle strength was found to be adequate range of motion is adequate.  Patient is noted to have significant equinus condition bilateral and has quite a bit of pain in the posterior heel region left over right centered mostly on the lateral side but also some central and medial pain.  Patient does have some enlargement within the area good digital perfusion well oriented x3 ? ?   ?Assessment:  ?A history of chronic with an acute flareup of Achilles tendinitis left over right with patient who has no ability currently to take time off ? ?   ?Plan:  ?H&P x-rays reviewed condition discussed at great length.  I discussed Achilles tendinitis and I reviewed different causes and treatments and ultimately this may be surgery but she cannot do that and she is desperate for something short-term and I did discuss injection with immobilization.  I did explain the chances for rupture associated with injection treatment and she is willing to accept this risk and I went ahead today and I did do a careful lateral injection of the posterior heel 3 mg dexamethasone Kenalog 5 mg  Xylocaine and applied for air fracture walker to mobilize.  I did discuss wearing this is much as possible for the next 3 weeks and also placed on oral diclofenac and I want to see her back in if I can get improvement on the left I will consider treating the right.  Again she does understand all risk associated with this and that ultimately surgery may be necessary ? ?X-rays indicate posterior spur formation bilateral back of heel at the insertional point of the Achilles tendon calcaneus ?   ? ? ?

## 2021-12-19 ENCOUNTER — Encounter: Payer: Self-pay | Admitting: Podiatry

## 2021-12-19 ENCOUNTER — Ambulatory Visit (INDEPENDENT_AMBULATORY_CARE_PROVIDER_SITE_OTHER): Payer: BC Managed Care – PPO | Admitting: Podiatry

## 2021-12-19 DIAGNOSIS — M7661 Achilles tendinitis, right leg: Secondary | ICD-10-CM

## 2021-12-19 DIAGNOSIS — M7662 Achilles tendinitis, left leg: Secondary | ICD-10-CM | POA: Diagnosis not present

## 2021-12-19 DIAGNOSIS — M7751 Other enthesopathy of right foot: Secondary | ICD-10-CM | POA: Diagnosis not present

## 2021-12-19 MED ORDER — TRIAMCINOLONE ACETONIDE 10 MG/ML IJ SUSP
10.0000 mg | Freq: Once | INTRAMUSCULAR | Status: AC
  Administered 2021-12-19: 10 mg

## 2021-12-19 NOTE — Progress Notes (Signed)
Subjective:  ? ?Patient ID: Beth Holloway, female   DOB: 46 y.o.   MRN: [id number]  ? ?HPI ?Patient presents stating she is somewhat improved in the back of her heel and is still wearing her boot as best as possible.  States she also has some pain right but is also developed a lot of forefoot pain right with inflammation around the fifth metatarsal bone.  Patient is optimistic but still having pain ? ? ?ROS ? ? ?   ?Objective:  ?Physical Exam  ?Severe Achilles tendinitis left over right that is showing some signs of improvement but still quite sore with inflamed tailor's bunion deformity right with keratotic plantar lesion ? ?   ?Assessment:  ?Reviewed Achilles tendinitis reviewed the tailor's bunion deformity with inflammation and discussed my optimism with the continuation of treatment ? ?   ?Plan:  ?H&P went ahead for the right fifth MPJ I did sterile prep injected the capsule of the fifth MPJ 3 mg dexamethasone Kenalog 5 mg Xylocaine debrided the plantar lesion and patient will be seen back to recheck ?   ? ? ?

## 2022-01-02 DIAGNOSIS — Z6836 Body mass index (BMI) 36.0-36.9, adult: Secondary | ICD-10-CM | POA: Diagnosis not present

## 2022-01-02 DIAGNOSIS — K589 Irritable bowel syndrome without diarrhea: Secondary | ICD-10-CM | POA: Insufficient documentation

## 2022-01-02 DIAGNOSIS — Z124 Encounter for screening for malignant neoplasm of cervix: Secondary | ICD-10-CM | POA: Diagnosis not present

## 2022-01-02 DIAGNOSIS — Z1151 Encounter for screening for human papillomavirus (HPV): Secondary | ICD-10-CM | POA: Diagnosis not present

## 2022-01-02 DIAGNOSIS — N926 Irregular menstruation, unspecified: Secondary | ICD-10-CM | POA: Diagnosis not present

## 2022-01-02 DIAGNOSIS — Z01419 Encounter for gynecological examination (general) (routine) without abnormal findings: Secondary | ICD-10-CM | POA: Diagnosis not present

## 2022-01-02 DIAGNOSIS — D649 Anemia, unspecified: Secondary | ICD-10-CM | POA: Insufficient documentation

## 2022-01-03 ENCOUNTER — Ambulatory Visit (INDEPENDENT_AMBULATORY_CARE_PROVIDER_SITE_OTHER): Payer: BC Managed Care – PPO | Admitting: Family Medicine

## 2022-01-03 ENCOUNTER — Encounter: Payer: Self-pay | Admitting: Family Medicine

## 2022-01-03 VITALS — BP 120/80 | HR 92 | Temp 98.3°F | Ht 64.5 in | Wt 214.5 lb

## 2022-01-03 DIAGNOSIS — R300 Dysuria: Secondary | ICD-10-CM

## 2022-01-03 LAB — POCT URINALYSIS DIPSTICK
Bilirubin, UA: NEGATIVE
Glucose, UA: NEGATIVE
Ketones, UA: POSITIVE
Leukocytes, UA: NEGATIVE
Nitrite, UA: NEGATIVE
Protein, UA: POSITIVE — AB
Spec Grav, UA: >=1.03 — AB (ref 1.010–1.025)
Urobilinogen, UA: 0.2 E.U./dL
pH, UA: 5 (ref 5.0–8.0)

## 2022-01-03 LAB — HM PAP SMEAR: HPV, high-risk: NEGATIVE

## 2022-01-03 MED ORDER — VALACYCLOVIR HCL 500 MG PO TABS: 500.0000 mg | ORAL_TABLET | Freq: Every day | ORAL | 1 refills | Status: DC
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
## 2022-01-03 NOTE — Patient Instructions (Signed)
It was very nice to see you today!  Valacylvir-twice/day for 3 days then once/day for suppression.   PLEASE NOTE:  If you had any lab tests please let us know if you have not heard back within a few days. You may see your results on MyChart before we have a chance to review them but we will give you a call once they are reviewed by [place]. If we ordered any referrals today, please let us know if you have not heard from their office within the next week.   Please try these tips to maintain a healthy lifestyle:  Eat most of your calories during the day when you are active. Eliminate processed foods including packaged sweets (pies, cakes, cookies), reduce intake of potatoes, white bread, white pasta, and white rice. Look for whole grain options, oat flour or almond flour.  Each meal should contain half fruits/vegetables, one quarter protein, and one quarter carbs (no bigger than a computer mouse).  Cut down on sweet beverages. This includes juice, soda, and sweet tea. Also watch fruit intake, though this is a healthier sweet option, it still contains natural sugar! Limit to 3 servings daily.  Drink at least 1 glass of water with each meal and aim for at least 8 glasses per day  Exercise at least 150 minutes every week.

## 2022-01-05 LAB — URINE CULTURE
MICRO NUMBER:: 13420577
SPECIMEN QUALITY:: ADEQUATE

## 2022-01-06 ENCOUNTER — Telehealth: Payer: Self-pay

## 2022-01-06 ENCOUNTER — Other Ambulatory Visit: Payer: Self-pay | Admitting: *Deleted

## 2022-01-06 MED ORDER — CEPHALEXIN 500 MG PO CAPS: 500.0000 mg | ORAL_CAPSULE | Freq: Two times a day (BID) | ORAL | 0 refills | Status: AC

## 2022-01-06 NOTE — Telephone Encounter (Signed)
Patient has called back in regard to lab results.  I have given patient Dr. Kathrin Ruddy response.  Patient understood.

## 2022-01-06 NOTE — Telephone Encounter (Signed)
Noted  

## 2022-01-09 DIAGNOSIS — N924 Excessive bleeding in the premenopausal period: Secondary | ICD-10-CM | POA: Diagnosis not present

## 2022-01-09 DIAGNOSIS — N926 Irregular menstruation, unspecified: Secondary | ICD-10-CM | POA: Diagnosis not present

## 2022-01-16 DIAGNOSIS — N920 Excessive and frequent menstruation with regular cycle: Secondary | ICD-10-CM | POA: Diagnosis not present

## 2022-03-06 DIAGNOSIS — N840 Polyp of corpus uteri: Secondary | ICD-10-CM | POA: Diagnosis not present

## 2022-03-24 DIAGNOSIS — D509 Iron deficiency anemia, unspecified: Secondary | ICD-10-CM | POA: Diagnosis not present

## 2022-05-06 ENCOUNTER — Encounter: Payer: Self-pay | Admitting: Family Medicine

## 2022-05-06 ENCOUNTER — Ambulatory Visit (INDEPENDENT_AMBULATORY_CARE_PROVIDER_SITE_OTHER): Payer: BC Managed Care – PPO | Admitting: Family Medicine

## 2022-05-06 VITALS — BP 130/70 | HR 95 | Temp 98.3°F | Ht 64.5 in | Wt 219.0 lb

## 2022-05-06 DIAGNOSIS — I10 Essential (primary) hypertension: Secondary | ICD-10-CM

## 2022-05-06 DIAGNOSIS — F341 Dysthymic disorder: Secondary | ICD-10-CM | POA: Diagnosis not present

## 2022-05-06 DIAGNOSIS — R7309 Other abnormal glucose: Secondary | ICD-10-CM | POA: Diagnosis not present

## 2022-05-06 MED ORDER — LISINOPRIL-HYDROCHLOROTHIAZIDE 10-12.5 MG PO TABS: 1.0000 | ORAL_TABLET | Freq: Every day | ORAL | 1 refills | Status: DC

## 2022-05-06 MED ORDER — LAMOTRIGINE 150 MG PO TABS: 150.0000 mg | ORAL_TABLET | Freq: Every day | ORAL | 1 refills | Status: DC

## 2022-05-06 MED ORDER — HYDROXYZINE HCL 10 MG PO TABS: 10.0000 mg | ORAL_TABLET | Freq: Three times a day (TID) | ORAL | 3 refills | Status: DC | PRN
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
## 2022-05-06 NOTE — Patient Instructions (Signed)
It was very nice to see you today!  Be fasting and drink water a lot  next visit   PLEASE NOTE:  If you had any lab tests please let us know if you have not heard back within a few days. You may see your results on MyChart before we have a chance to review them but we will give you a call once they are reviewed by [place]. If we ordered any referrals today, please let us know if you have not heard from their office within the next week.   Please try these tips to maintain a healthy lifestyle:  Eat most of your calories during the day when you are active. Eliminate processed foods including packaged sweets (pies, cakes, cookies), reduce intake of potatoes, white bread, white pasta, and white rice. Look for whole grain options, oat flour or almond flour.  Each meal should contain half fruits/vegetables, one quarter protein, and one quarter carbs (no bigger than a computer mouse).  Cut down on sweet beverages. This includes juice, soda, and sweet tea. Also watch fruit intake, though this is a healthier sweet option, it still contains natural sugar! Limit to 3 servings daily.  Drink at least 1 glass of water with each meal and aim for at least 8 glasses per day  Exercise at least 150 minutes every week.

## 2022-05-18 ENCOUNTER — Encounter (HOSPITAL_COMMUNITY): Payer: Self-pay

## 2022-05-18 ENCOUNTER — Other Ambulatory Visit: Payer: Self-pay

## 2022-05-18 ENCOUNTER — Emergency Department (HOSPITAL_COMMUNITY)
Admission: EM | Admit: 2022-05-18 | Discharge: 2022-05-18 | Disposition: A | Discharge status: Home / Self Care | Payer: BC Managed Care – PPO | Attending: Emergency Medicine | Admitting: Emergency Medicine

## 2022-05-18 DIAGNOSIS — M545 Low back pain, unspecified: Secondary | ICD-10-CM | POA: Insufficient documentation

## 2022-05-18 DIAGNOSIS — N3000 Acute cystitis without hematuria: Secondary | ICD-10-CM

## 2022-05-18 DIAGNOSIS — N9489 Other specified conditions associated with female genital organs and menstrual cycle: Secondary | ICD-10-CM | POA: Insufficient documentation

## 2022-05-18 DIAGNOSIS — R350 Frequency of micturition: Secondary | ICD-10-CM | POA: Diagnosis not present

## 2022-05-18 LAB — URINALYSIS, ROUTINE W REFLEX MICROSCOPIC
Bacteria, UA: NONE SEEN
Bilirubin Urine: NEGATIVE
Glucose, UA: NEGATIVE mg/dL
Hgb urine dipstick: NEGATIVE
Ketones, ur: NEGATIVE mg/dL
Nitrite: POSITIVE — AB
Protein, ur: 30 mg/dL — AB
Specific Gravity, Urine: 1.035 — ABNORMAL HIGH (ref 1.005–1.030)
pH: 5 (ref 5.0–8.0)

## 2022-05-18 LAB — I-STAT BETA HCG BLOOD, ED (MC, WL, AP ONLY): I-stat hCG, quantitative: <5 m[IU]/mL (ref <5)

## 2022-05-18 MED ORDER — CEPHALEXIN 500 MG PO CAPS: 500.0000 mg | ORAL_CAPSULE | Freq: Two times a day (BID) | ORAL | 0 refills | Status: DC

## 2022-05-18 MED ORDER — CEPHALEXIN 500 MG PO CAPS
500.0000 mg | ORAL_CAPSULE | Freq: Once | ORAL | Status: AC
Start: 2022-05-18 — End: 2022-05-18
  Administered 2022-05-18: 500 mg via ORAL
  Filled 2022-05-18: qty 1

## 2022-05-18 NOTE — ED Provider Notes (Signed)
[facility] DEPT [facility] Emergency Department Provider Note MRN:  [id number]  Arrival date & time: 05/18/22     Chief Complaint   Urinary Frequency   History of Present Illness   Beth Holloway is a 46 y.o. year-old female presents to the ED with chief complaint of urinary frequency, urgency, and dysuria for the past week or 2.  She states that she had a UTI about 1 month ago.  She states that the symptoms have returned and feel similar.  She denies fevers or chills.  She reports some mild low back pain.  Denies having any vomiting.  History provided by patient.   Review of Systems  Pertinent positive and negative review of systems noted in HPI.    Physical Exam   Vitals:   05/18/22 0104 05/18/22 0105  BP: (!) 168/102   Pulse: 94   Resp: 18   Temp: 98.1 F (36.7 C)   SpO2: 100% 100%    CONSTITUTIONAL:  well-appearing, NAD NEURO:  Alert and oriented x 3, CN 3-12 grossly intact EYES:  eyes equal and reactive ENT/NECK:  Supple, no stridor  CARDIO:  normal rate, regular rhythm, appears well-perfused  PULM:  No respiratory distress, CTAB GI/GU:  non-distended,  MSK/SPINE:  No gross deformities, no edema, moves all extremities  SKIN:  no rash, atraumatic   *Additional and/or pertinent findings included in MDM below  Diagnostic and Interventional Summary    EKG Interpretation  Date/Time:    Ventricular Rate:    PR Interval:    QRS Duration:   QT Interval:    QTC Calculation:   R Axis:     Text Interpretation:         Labs Reviewed  URINALYSIS, ROUTINE W REFLEX MICROSCOPIC - Abnormal; Notable for the following components:      Result Value   Color, Urine AMBER (*)    Specific Gravity, Urine 1.035 (*)    Protein, ur 30 (*)    Nitrite POSITIVE (*)    Leukocytes,Ua TRACE (*)    All other components within normal limits  URINE CULTURE  I-STAT BETA HCG BLOOD, ED ([facility], [facility], [facility] ONLY)    No orders to display    Medications  cephALEXin (KEFLEX)  capsule 500 mg (has no administration in time range)     Procedures  /  Critical Care Procedures  ED Course and Medical Decision Making  I have reviewed the triage vital signs, the nursing notes, and pertinent available records from the EMR.  Social Determinants Affecting Complexity of Care: Patient has no clinically significant social determinants affecting this chief complaint..   ED Course:    Medical Decision Making Patient here with urinary symptoms including dysuria, urgency, and frequency.  Reports recent UTI.  Uncertain what antibiotic she took.  Pregnancy test negative.  Urinalysis consistent with infection.  Vital signs are stable.  Patient also discussed how she has IBS and is intermittently constipated and bloated.  I have encouraged her to maintain a high-fiber diet and stay well-hydrated and follow-up with her doctor for any new or worsening symptoms.  Tonight her abdomen is soft and nontender.  Do not feel that she needs further work-up or advanced imaging tonight.  Amount and/or Complexity of Data Reviewed Labs: ordered.    Details: Urinalysis concerning for infection  Risk Prescription drug management.     Consultants: No consultations were needed in caring for this patient.   Treatment and Plan: Emergency department workup does not suggest an emergent  condition requiring admission or immediate intervention beyond  what has been performed at this time. The patient is safe for discharge and has  been instructed to return immediately for worsening symptoms, change in  symptoms or any other concerns    Final Clinical Impressions(s) / ED Diagnoses     ICD-10-CM   1. Acute cystitis without hematuria  N30.00       ED Discharge Orders          Ordered    cephALEXin (KEFLEX) 500 MG capsule  2 times daily        05/18/22 0207              Discharge Instructions Discussed with and Provided to Patient:    Discharge Instructions      Your  urinalysis was consistent with infection.  Please take antibiotics as directed.  We have sent your urine for a culture.  If the antibiotic needs to be changed, we will notify you upon receiving the culture results.  Please follow-up with your regular doctor.  Return for new or worsening symptoms.      Montine Circle, PA-C 05/18/22 0210    Fatima Blank, MD 05/18/22 [phone]

## 2022-05-18 NOTE — ED Triage Notes (Signed)
Patient reports urinary frequency, painful urination and tingling when voiding. Patient took [place] at home.

## 2022-05-18 NOTE — Discharge Instructions (Addendum)
Your urinalysis was consistent with infection.  Please take antibiotics as directed.  We have sent your urine for a culture.  If the antibiotic needs to be changed, we will notify you upon receiving the culture results.  Please follow-up with your regular doctor.  Return for new or worsening symptoms.

## 2022-05-19 LAB — URINE CULTURE: Culture: 50000 — AB

## 2022-05-20 ENCOUNTER — Telehealth (HOSPITAL_BASED_OUTPATIENT_CLINIC_OR_DEPARTMENT_OTHER): Payer: Self-pay | Admitting: Emergency Medicine

## 2022-05-20 NOTE — Telephone Encounter (Signed)
Post ED Visit - Positive Culture Follow-up  Culture report reviewed by antimicrobial stewardship pharmacist: [place] Team []  Elenor Quinones, Pharm.D. []  Heide Guile, Pharm.D., BCPS AQ-ID []  Parks Neptune, Pharm.D., BCPS []  Alycia Rossetti, Pharm.D., BCPS []  [place], [place].D., BCPS, AAHIVP []  Legrand Como, Pharm.D., BCPS, AAHIVP []  Salome Arnt, PharmD, BCPS []  Johnnette Gourd, PharmD, BCPS []  Hughes Better, PharmD, BCPS []  Leeroy Cha, PharmD []  Laqueta Linden, PharmD, BCPS []  Albertina Parr, PharmD  [place] Team []  Leodis Sias, PharmD []  Lindell Spar, PharmD []  Royetta Asal, PharmD []  Graylin Shiver, Rph []  Rema Fendt) Glennon Mac, PharmD []  Arlyn Dunning, PharmD []  Netta Cedars, PharmD []  Dia Sitter, PharmD []  Leone Haven, PharmD []  Gretta Arab, PharmD []  Theodis Shove, PharmD []  Peggyann Juba, PharmD []  Reuel Boom, PharmD Jimmy Footman PharmD   Positive urine culture Treated with cephalexin, organism sensitive to the same and no further patient follow-up is required at this time.  Hazle Nordmann 05/20/2022, 10:38 AM

## 2022-06-03 ENCOUNTER — Other Ambulatory Visit: Payer: Self-pay | Admitting: Family Medicine

## 2022-10-15 ENCOUNTER — Encounter (HOSPITAL_COMMUNITY): Payer: Self-pay

## 2022-10-15 ENCOUNTER — Emergency Department (HOSPITAL_COMMUNITY)
Admission: EM | Admit: 2022-10-15 | Discharge: 2022-10-15 | Disposition: A | Discharge status: Home / Self Care | Payer: No Typology Code available for payment source | Attending: Emergency Medicine | Admitting: Emergency Medicine

## 2022-10-15 ENCOUNTER — Emergency Department (HOSPITAL_COMMUNITY): Payer: No Typology Code available for payment source

## 2022-10-15 ENCOUNTER — Other Ambulatory Visit: Payer: Self-pay

## 2022-10-15 DIAGNOSIS — S80211A Abrasion, right knee, initial encounter: Secondary | ICD-10-CM | POA: Insufficient documentation

## 2022-10-15 DIAGNOSIS — S8991XA Unspecified injury of right lower leg, initial encounter: Secondary | ICD-10-CM | POA: Diagnosis present

## 2022-10-15 DIAGNOSIS — W19XXXA Unspecified fall, initial encounter: Secondary | ICD-10-CM | POA: Insufficient documentation

## 2022-10-15 DIAGNOSIS — M79642 Pain in left hand: Secondary | ICD-10-CM | POA: Insufficient documentation

## 2022-10-15 DIAGNOSIS — M79641 Pain in right hand: Secondary | ICD-10-CM | POA: Diagnosis not present

## 2022-10-15 MED ORDER — OXYCODONE-ACETAMINOPHEN 5-325 MG PO TABS
2.0000 | ORAL_TABLET | Freq: Once | ORAL | Status: AC
  Administered 2022-10-15: 2 via ORAL
  Filled 2022-10-15: qty 2

## 2022-10-15 MED ORDER — ONDANSETRON 4 MG PO TBDP
4.0000 mg | ORAL_TABLET | Freq: Once | ORAL | Status: AC
  Administered 2022-10-15: 4 mg via ORAL
  Filled 2022-10-15: qty 1

## 2022-10-15 NOTE — Discharge Instructions (Signed)
Please follow-up with your doctor in 1 week if not better. Wear the splints for about a week.  Longer if still hurting.  Use ice, tylenol, and ibuprofen.
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
## 2022-10-15 NOTE — ED Triage Notes (Signed)
Pt states that she had a fall today and hurt both hands and skinned her right knee. Pt ambulatory to room.

## 2022-10-20 NOTE — ED Notes (Signed)
Pt wanted work note. One was given at discharge.

## 2022-10-31 ENCOUNTER — Other Ambulatory Visit: Payer: Self-pay | Admitting: Family Medicine

## 2022-11-04 ENCOUNTER — Encounter: Payer: Self-pay | Admitting: Family Medicine

## 2022-11-04 ENCOUNTER — Ambulatory Visit (INDEPENDENT_AMBULATORY_CARE_PROVIDER_SITE_OTHER): Payer: No Typology Code available for payment source | Admitting: Family Medicine

## 2022-11-04 VITALS — BP 122/72 | HR 105 | Temp 98.6°F | Ht 64.5 in | Wt 231.2 lb

## 2022-11-04 DIAGNOSIS — E7800 Pure hypercholesterolemia, unspecified: Secondary | ICD-10-CM | POA: Diagnosis not present

## 2022-11-04 DIAGNOSIS — F341 Dysthymic disorder: Secondary | ICD-10-CM | POA: Diagnosis not present

## 2022-11-04 DIAGNOSIS — R7989 Other specified abnormal findings of blood chemistry: Secondary | ICD-10-CM | POA: Diagnosis not present

## 2022-11-04 DIAGNOSIS — I10 Essential (primary) hypertension: Secondary | ICD-10-CM

## 2022-11-04 DIAGNOSIS — R7303 Prediabetes: Secondary | ICD-10-CM | POA: Diagnosis not present

## 2022-11-04 DIAGNOSIS — Z1211 Encounter for screening for malignant neoplasm of colon: Secondary | ICD-10-CM

## 2022-11-04 MED ORDER — LAMOTRIGINE 200 MG PO TABS: 200.0000 mg | ORAL_TABLET | Freq: Every day | ORAL | 1 refills | Status: DC

## 2022-11-04 MED ORDER — HYDROXYZINE HCL 10 MG PO TABS: 10.0000 mg | ORAL_TABLET | Freq: Three times a day (TID) | ORAL | 1 refills | Status: DC | PRN

## 2022-11-04 MED ORDER — LISINOPRIL-HYDROCHLOROTHIAZIDE 10-12.5 MG PO TABS: 1.0000 | ORAL_TABLET | Freq: Every day | ORAL | 1 refills | Status: DC
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
## 2022-11-04 NOTE — Patient Instructions (Addendum)
It was very nice to see you today!  Increase lamictal to 200mg .  Let me know  Consider KT tape(rock tape) for the feet.  [id number] and zepbound-call insurance and check  Low inflammatory diet   PLEASE NOTE:  If you had any lab tests please let us know if you have not heard back within a few days. You may see your results on MyChart before we have a chance to review them but we will give you a call once they are reviewed by [place]. If we ordered any referrals today, please let us know if you have not heard from their office within the next week.   Please try these tips to maintain a healthy lifestyle:  Eat most of your calories during the day when you are active. Eliminate processed foods including packaged sweets (pies, cakes, cookies), reduce intake of potatoes, white bread, white pasta, and white rice. Look for whole grain options, oat flour or almond flour.  Each meal should contain half fruits/vegetables, one quarter protein, and one quarter carbs (no bigger than a computer mouse).  Cut down on sweet beverages. This includes juice, soda, and sweet tea. Also watch fruit intake, though this is a healthier sweet option, it still contains natural sugar! Limit to 3 servings daily.  Drink at least 1 glass of water with each meal and aim for at least 8 glasses per day  Exercise at least 150 minutes every week.

## 2022-11-05 LAB — HEMOGLOBIN A1C: Hgb A1c MFr Bld: 5.8 % (ref 4.6–6.5)

## 2022-11-05 LAB — COMPREHENSIVE METABOLIC PANEL
ALT: 14 U/L (ref 0–35)
AST: 13 U/L (ref 0–37)
Albumin: 4.5 g/dL (ref 3.5–5.2)
Alkaline Phosphatase: 73 U/L (ref 39–117)
BUN: 18 mg/dL (ref 6–23)
CO2: 25 mEq/L (ref 19–32)
Calcium: 9.3 mg/dL (ref 8.4–10.5)
Chloride: 103 mEq/L (ref 96–112)
Creatinine, Ser: 0.78 mg/dL (ref 0.40–1.20)
GFR: 91.79 mL/min (ref >60.00)
Glucose, Bld: 90 mg/dL (ref 70–99)
Potassium: 3.5 mEq/L (ref 3.5–5.1)
Sodium: 140 mEq/L (ref 135–145)
Total Bilirubin: 0.3 mg/dL (ref 0.2–1.2)
Total Protein: 7.3 g/dL (ref 6.0–8.3)

## 2022-11-05 LAB — LIPID PANEL
Cholesterol: 224 mg/dL — ABNORMAL HIGH (ref 0–200)
HDL: 66.5 mg/dL (ref >39.00)
LDL Cholesterol: 123 mg/dL — ABNORMAL HIGH (ref 0–99)
NonHDL: 157.55
Total CHOL/HDL Ratio: 3
Triglycerides: 175 mg/dL — ABNORMAL HIGH (ref 0.0–149.0)
VLDL: 35 mg/dL (ref 0.0–40.0)

## 2022-11-05 LAB — MICROALBUMIN / CREATININE URINE RATIO
Creatinine,U: 141.6 mg/dL
Microalb Creat Ratio: 1.6 mg/g (ref 0.0–30.0)
Microalb, Ur: 2.3 mg/dL — ABNORMAL HIGH (ref 0.0–1.9)

## 2022-11-05 LAB — CBC WITH DIFFERENTIAL/PLATELET
Basophils Absolute: 0.1 10*3/uL (ref 0.0–0.1)
Basophils Relative: 0.9 % (ref 0.0–3.0)
Eosinophils Absolute: 0.1 10*3/uL (ref 0.0–0.7)
Eosinophils Relative: 1.2 % (ref 0.0–5.0)
HCT: 40.4 % (ref 36.0–46.0)
Hemoglobin: 13 g/dL (ref 12.0–15.0)
Lymphocytes Relative: 16.6 % (ref 12.0–46.0)
Lymphs Abs: 1.5 10*3/uL (ref 0.7–4.0)
MCHC: 32.2 g/dL (ref 30.0–36.0)
MCV: 86 fl (ref 78.0–100.0)
Monocytes Absolute: 0.5 10*3/uL (ref 0.1–1.0)
Monocytes Relative: 6.1 % (ref 3.0–12.0)
Neutro Abs: 6.7 10*3/uL (ref 1.4–7.7)
Neutrophils Relative %: 75.2 % (ref 43.0–77.0)
Platelets: 306 10*3/uL (ref 150.0–400.0)
RBC: 4.69 Mil/uL (ref 3.87–5.11)
RDW: 14.5 % (ref 11.5–15.5)
WBC: 8.9 10*3/uL (ref 4.0–10.5)

## 2022-11-05 LAB — TSH: TSH: 4.34 u[IU]/mL (ref 0.35–5.50)

## 2022-11-05 NOTE — Progress Notes (Signed)
1.  A1C(3 month average of sugars) is elevated.  This is considered PreDiabetes.  Work on diet-decrease sugars and starches and aim for 30 minutes of exercise 5 days/week to prevent progression to diabetes    2.  Your cholesterol levels are elevated.  Work on low cholesterol and lower carbs/sugars diet and  get exercise to try to lower your cholesterol.  3.  Potassium is barely normal-does she want to take potassium supplement over-the-counter, work on getting extra potassium in the diet, or take prescription potassium 10 mEq daily? 4.  Thyroid is a little borderline.  We will recheck it in 3 months.  See if lab can add free T4, free T3 to the blood that was just drawn.

## 2022-11-06 ENCOUNTER — Other Ambulatory Visit: Payer: Self-pay | Admitting: *Deleted

## 2022-11-06 ENCOUNTER — Telehealth: Payer: Self-pay | Admitting: Family Medicine

## 2022-11-06 DIAGNOSIS — R7989 Other specified abnormal findings of blood chemistry: Secondary | ICD-10-CM

## 2022-11-06 LAB — T4, FREE: Free T4: 0.79 ng/dL (ref 0.60–1.60)

## 2022-11-06 LAB — T3, FREE: T3, Free: 3.4 pg/mL (ref 2.3–4.2)

## 2022-11-06 NOTE — Telephone Encounter (Signed)
Patient states insurance is unable to let Patient know if Wegovy or Zepbound is covered unless Patient tells them the [place] of the medications.  Patient requests to be called to be advised.

## 2022-11-06 NOTE — Telephone Encounter (Signed)
Patient notified and verbalized understanding. 

## 2022-11-06 NOTE — Telephone Encounter (Signed)
Please see message below

## 2022-11-07 NOTE — Telephone Encounter (Signed)
Please advise 

## 2022-11-07 NOTE — Telephone Encounter (Signed)
Pt states Zepbound & Beth Holloway are not covered by her insurance. Pt would like to know what to do next. Please advise

## 2022-11-10 ENCOUNTER — Encounter: Payer: Self-pay | Admitting: *Deleted

## 2022-11-10 NOTE — Telephone Encounter (Signed)
Patient notified of message below.

## 2022-12-30 ENCOUNTER — Telehealth: Payer: Self-pay | Admitting: Family Medicine

## 2022-12-30 NOTE — Telephone Encounter (Signed)
Patient wants to let Dr. Ruthine Dose know that her blood pressure is not improving with current treatment.  Patient is scheduled for OV 02/04/23

## 2022-12-30 NOTE — Telephone Encounter (Signed)
Spoke with patient and she stated that she has been taking blood pressures weekly since last visit and they have been running in the 130's over 90's and her heart rate was 101. Patient stated blood pressure readings have not gotten any better. Please advise.

## 2022-12-31 ENCOUNTER — Other Ambulatory Visit: Payer: Self-pay | Admitting: *Deleted

## 2022-12-31 MED ORDER — CARVEDILOL 6.25 MG PO TABS: 6.2500 mg | ORAL_TABLET | Freq: Two times a day (BID) | ORAL | 3 refills | Status: DC

## 2022-12-31 NOTE — Telephone Encounter (Signed)
Patient stated that the machine she is using is her grandmothers, she assume machine is working correctly. Per Dr. Ruthine Dose, Coreg 6.25 mg sent to the pharmacy, not Bystolic.

## 2023-01-07 ENCOUNTER — Ambulatory Visit (INDEPENDENT_AMBULATORY_CARE_PROVIDER_SITE_OTHER): Payer: No Typology Code available for payment source | Admitting: Family Medicine

## 2023-01-07 ENCOUNTER — Encounter: Payer: Self-pay | Admitting: Family Medicine

## 2023-01-07 VITALS — BP 130/80 | HR 91 | Temp 98.4°F | Resp 18 | Ht 64.5 in | Wt 232.1 lb

## 2023-01-07 DIAGNOSIS — I10 Essential (primary) hypertension: Secondary | ICD-10-CM

## 2023-01-07 DIAGNOSIS — R053 Chronic cough: Secondary | ICD-10-CM | POA: Diagnosis not present

## 2023-01-07 MED ORDER — ALBUTEROL SULFATE HFA 108 (90 BASE) MCG/ACT IN AERS: 2.0000 | INHALATION_SPRAY | Freq: Four times a day (QID) | RESPIRATORY_TRACT | 1 refills | Status: AC | PRN

## 2023-01-07 MED ORDER — HYDROXYZINE PAMOATE 25 MG PO CAPS: 25.0000 mg | ORAL_CAPSULE | Freq: Three times a day (TID) | ORAL | 3 refills | Status: DC | PRN

## 2023-01-07 MED ORDER — OMEPRAZOLE 40 MG PO CPDR: 40.0000 mg | DELAYED_RELEASE_CAPSULE | Freq: Every day | ORAL | 3 refills | Status: DC

## 2023-01-07 MED ORDER — TELMISARTAN-HCTZ 40-12.5 MG PO TABS: 1.0000 | ORAL_TABLET | Freq: Every day | ORAL | 0 refills | Status: DC
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
## 2023-01-07 NOTE — Patient Instructions (Signed)
Change lisinopril/hct to telmisartan/hct Albuterol inhaler. Continue carvedilol-set your phone.  Do the hydroxyzine pamoate 25 mg for anxiety/sleep.

## 2023-02-04 ENCOUNTER — Ambulatory Visit (INDEPENDENT_AMBULATORY_CARE_PROVIDER_SITE_OTHER): Payer: No Typology Code available for payment source | Admitting: Family Medicine

## 2023-02-04 ENCOUNTER — Other Ambulatory Visit: Payer: Self-pay | Admitting: Family Medicine

## 2023-02-04 ENCOUNTER — Encounter: Payer: Self-pay | Admitting: Family Medicine

## 2023-02-04 ENCOUNTER — Other Ambulatory Visit: Payer: Self-pay | Admitting: *Deleted

## 2023-02-04 ENCOUNTER — Ambulatory Visit (INDEPENDENT_AMBULATORY_CARE_PROVIDER_SITE_OTHER): Payer: No Typology Code available for payment source

## 2023-02-04 VITALS — BP 118/81 | HR 88 | Temp 98.6°F | Resp 18 | Ht 64.5 in | Wt 235.1 lb

## 2023-02-04 DIAGNOSIS — F341 Dysthymic disorder: Secondary | ICD-10-CM

## 2023-02-04 DIAGNOSIS — R053 Chronic cough: Secondary | ICD-10-CM | POA: Diagnosis not present

## 2023-02-04 DIAGNOSIS — F411 Generalized anxiety disorder: Secondary | ICD-10-CM

## 2023-02-04 DIAGNOSIS — R002 Palpitations: Secondary | ICD-10-CM

## 2023-02-04 DIAGNOSIS — R5383 Other fatigue: Secondary | ICD-10-CM | POA: Diagnosis not present

## 2023-02-04 DIAGNOSIS — I10 Essential (primary) hypertension: Secondary | ICD-10-CM | POA: Diagnosis not present

## 2023-02-04 DIAGNOSIS — R0789 Other chest pain: Secondary | ICD-10-CM

## 2023-02-04 MED ORDER — TELMISARTAN-HCTZ 40-12.5 MG PO TABS: 1.0000 | ORAL_TABLET | Freq: Every day | ORAL | 0 refills | Status: DC

## 2023-02-04 NOTE — Assessment & Plan Note (Signed)
Chronic.  Gets spasmatic coughing.  Thought it might have been due to ACE inhibitor, however still occurring even with changes.  Also could be GERD, however does not seem to have improved much with 1 month of Prilosec 40 mg.  Could be allergies-advised to take Claritin 10 mg daily.  May be vocal cord dysfunction (seems to occur if she is talking a lot).  Patient declines steroids.  Advised this time to get a chest x-ray to make sure everything is okay.  Also, demoed proper albuterol use.  Will refer to pulmonary as well.  Things are really starting to cause a lot of distress for her.

## 2023-02-04 NOTE — Assessment & Plan Note (Signed)
Chronic.  Not well-controlled.  Has been on several meds.  Lamictal had been working well, but then not as well.  We increased the dose.  Does not seem to be helping.  She does have an appointment with a psychiatrist in July and would prefer to wait on any medication changes.

## 2023-02-04 NOTE — Patient Instructions (Addendum)
It was very nice to see you today!  Claritin 10mg  daily  get X-ray/labs at [facility].  [address]  hours 8=M-F 8:30-5.  closed 12:30-1 lunch    PLEASE NOTE:  If you had any lab tests please let us know if you have not heard back within a few days. You may see your results on MyChart before we have a chance to review them but we will give you a call once they are reviewed by [place]. If we ordered any referrals today, please let us know if you have not heard from their office within the next week.   Please try these tips to maintain a healthy lifestyle:  Eat most of your calories during the day when you are active. Eliminate processed foods including packaged sweets (pies, cakes, cookies), reduce intake of potatoes, white bread, white pasta, and white rice. Look for whole grain options, oat flour or almond flour.  Each meal should contain half fruits/vegetables, one quarter protein, and one quarter carbs (no bigger than a computer mouse).  Cut down on sweet beverages. This includes juice, soda, and sweet tea. Also watch fruit intake, though this is a healthier sweet option, it still contains natural sugar! Limit to 3 servings daily.  Drink at least 1 glass of water with each meal and aim for at least 8 glasses per day  Exercise at least 150 minutes every week.

## 2023-02-04 NOTE — Assessment & Plan Note (Signed)
Chronic.  Controlled.  Continue telmisartan HCT 40/12.5.

## 2023-02-04 NOTE — Assessment & Plan Note (Signed)
Chronic.  Better controlled on carvedilol 6.25 mg twice daily.  Continue
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
## 2023-02-11 ENCOUNTER — Ambulatory Visit (INDEPENDENT_AMBULATORY_CARE_PROVIDER_SITE_OTHER): Payer: No Typology Code available for payment source | Admitting: Podiatry

## 2023-02-11 DIAGNOSIS — M7661 Achilles tendinitis, right leg: Secondary | ICD-10-CM

## 2023-02-11 DIAGNOSIS — M7662 Achilles tendinitis, left leg: Secondary | ICD-10-CM

## 2023-02-11 MED ORDER — DICLOFENAC SODIUM 75 MG PO TBEC: 75.0000 mg | DELAYED_RELEASE_TABLET | Freq: Two times a day (BID) | ORAL | 2 refills | Status: DC

## 2023-02-12 NOTE — Progress Notes (Signed)
Subjective:   Patient ID: Beth Holloway, female   DOB: 47 y.o.   MRN: [id number]   HPI Patient presents stating her heels are just awful and she is known she is needed to get it fixed and she can finally fixate it November with her work schedule.  Knows that both heels are getting need to be corrected and wants to see if there is anything we can do temporarily   ROS      Objective:  Physical Exam  Neuro vas status intact patient has significant spurring posterior aspect heel region bilateral with exquisite discomfort at the insertion to the calcaneus bilateral left over right     Assessment:  Chronic Achilles tendinitis bilateral that did not respond to previous injection immobilization     Plan:  H&P reviewed and I do think posterior bone resection along with Achilles tendon detachment and possible elongation of the Achilles will be necessary.  Patient wants to get this done but we will not be able to do this until later in the year and I am referring this to Dr. Allena Katz for evaluation in November and for Achilles tendon surgery 1 foot at a time left first.  I educated her on the fact will be nonweightbearing for a significant period of time and that recovery will take a number of months and I did write her a prescription today for diclofenac to try to keep the inflammation under control

## 2023-03-09 ENCOUNTER — Encounter: Payer: Self-pay | Admitting: Family Medicine

## 2023-03-10 ENCOUNTER — Encounter: Payer: Self-pay | Admitting: Family Medicine

## 2023-03-10 ENCOUNTER — Other Ambulatory Visit: Payer: Self-pay | Admitting: *Deleted

## 2023-03-10 DIAGNOSIS — R7989 Other specified abnormal findings of blood chemistry: Secondary | ICD-10-CM

## 2023-03-24 ENCOUNTER — Ambulatory Visit (INDEPENDENT_AMBULATORY_CARE_PROVIDER_SITE_OTHER): Payer: No Typology Code available for payment source | Admitting: Pulmonary Disease

## 2023-03-24 ENCOUNTER — Encounter: Payer: Self-pay | Admitting: Internal Medicine

## 2023-03-24 ENCOUNTER — Ambulatory Visit: Payer: No Typology Code available for payment source | Attending: Internal Medicine | Admitting: Internal Medicine

## 2023-03-24 ENCOUNTER — Encounter: Payer: Self-pay | Admitting: Pulmonary Disease

## 2023-03-24 VITALS — BP 112/70 | HR 88 | Temp 98.0°F | Ht 65.0 in | Wt 240.0 lb

## 2023-03-24 VITALS — BP 118/78 | HR 77 | Ht 65.0 in | Wt 240.8 lb

## 2023-03-24 DIAGNOSIS — R053 Chronic cough: Secondary | ICD-10-CM

## 2023-03-24 DIAGNOSIS — R002 Palpitations: Secondary | ICD-10-CM | POA: Diagnosis not present

## 2023-03-24 MED ORDER — PREDNISONE 20 MG PO TABS: ORAL_TABLET | ORAL | 0 refills | Status: AC

## 2023-03-24 MED ORDER — PROMETHAZINE-DM 6.25-15 MG/5ML PO SYRP: 5.0000 mL | ORAL_SOLUTION | Freq: Every evening | ORAL | 1 refills | Status: DC | PRN

## 2023-03-24 NOTE — Patient Instructions (Signed)
Nice to meet you  Take prednisone 40 mg for 5 days then 20 mg for 5 days then stop  Take Breztri 2 puffs in the morning and 2 puffs in the evening, every day.  Rinse mouth out with water after every use.  Based on your chest x-ray and this getting worse after bronchitis, I am worried about the development of asthma causing your cough.  The medications are designed to try to help with inflammation in the air tubes and decrease spasm that can cause cough related to asthma.  Return to clinic in 4 weeks or sooner if needed with Dr. Judeth Horn
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
## 2023-03-24 NOTE — Patient Instructions (Signed)
Medication Instructions:  Your physician recommends that you continue on your current medications as directed. Please refer to the Current Medication list given to you today.  *If you need a refill on your cardiac medications before your next appointment, please call your pharmacy*   Lab Work: None needed  If you have labs (blood work) drawn today and your tests are completely normal, you will receive your results only by: MyChart Message (if you have MyChart) OR A paper copy in the mail If you have any lab test that is abnormal or we need to change your treatment, we will call you to review the results.   Testing/Procedures: None needed   Follow-Up: At [facility], you and your health needs are our priority.  As part of our continuing mission to provide you with exceptional heart care, we have created designated Provider Care Teams.  These Care Teams include your primary Cardiologist (physician) and Advanced Practice Providers (APPs -  Physician Assistants and Nurse Practitioners) who all work together to provide you with the care you need, when you need it.     Your next appointment:   F/u as needed  Provider:   Maisie Fus, MD

## 2023-03-26 DIAGNOSIS — F4310 Post-traumatic stress disorder, unspecified: Secondary | ICD-10-CM | POA: Insufficient documentation

## 2023-03-26 DIAGNOSIS — F331 Major depressive disorder, recurrent, moderate: Secondary | ICD-10-CM | POA: Insufficient documentation
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
## 2023-05-19 ENCOUNTER — Encounter: Payer: Self-pay | Admitting: Family Medicine

## 2023-05-19 ENCOUNTER — Other Ambulatory Visit: Payer: Self-pay | Admitting: Family Medicine

## 2023-05-19 MED ORDER — LAMOTRIGINE 200 MG PO TABS: 200.0000 mg | ORAL_TABLET | Freq: Every day | ORAL | 0 refills | Status: AC

## 2023-05-22 IMAGING — CT CT RENAL STONE PROTOCOL
2 of 4 series · 15 of 46 positions shown, 17 images · non-contrast
Comparison: CT abdomen and pelvis without contrast 07/06/2020

CLINICAL DATA: Flank pain. Kidney stone suspected. Left flank pain
since [DATE] a.m. Microhematuria.



[Series 2: axial st · axial · 0.77mm/px · z∈[-350,+70]mm · 12 of 96 slices shown, 14 images]
[im 6/96  soft-tissue]
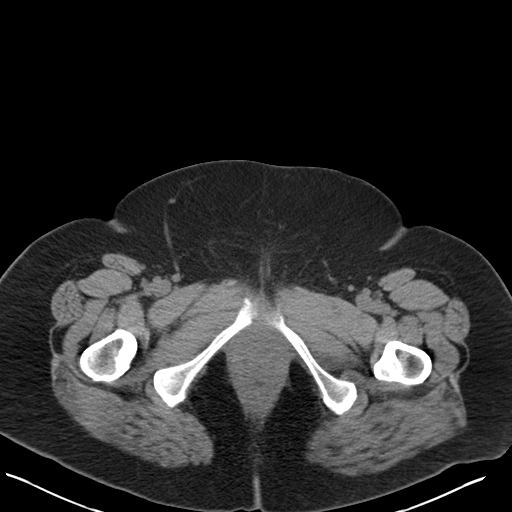
[im 6/96  bone]
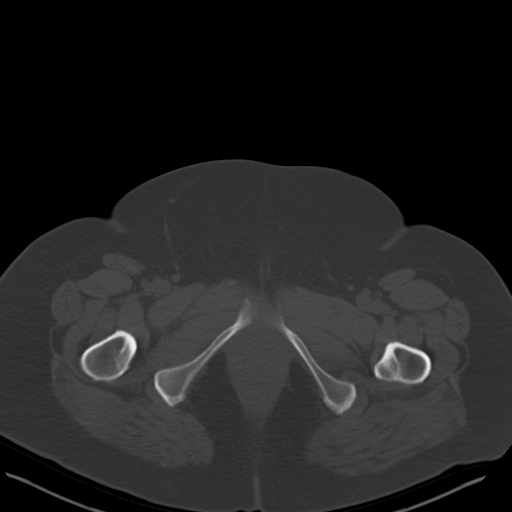
[im 16/96  soft-tissue]
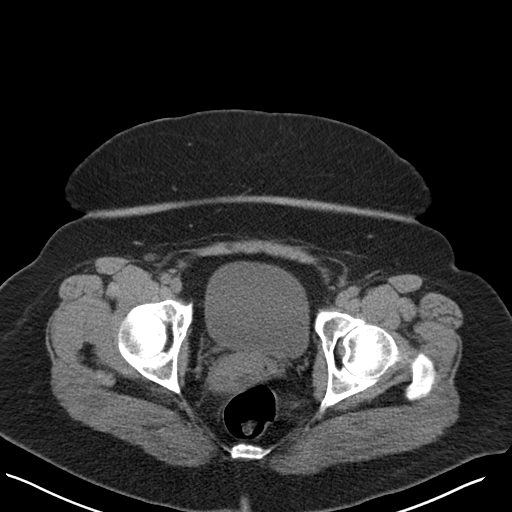
[im 22/96  soft-tissue]
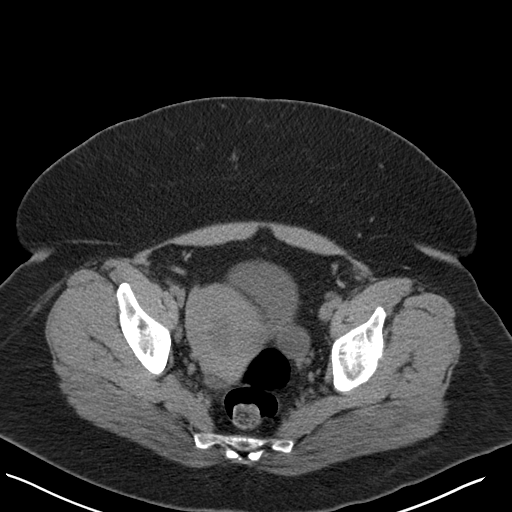
[im 27/96  soft-tissue]
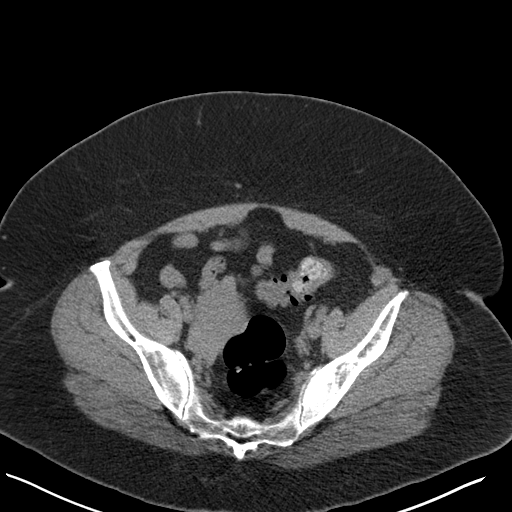
[im 37/96  soft-tissue]
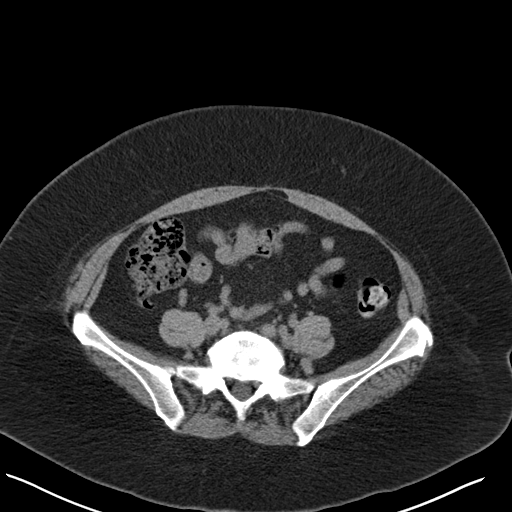
[im 43/96  soft-tissue]
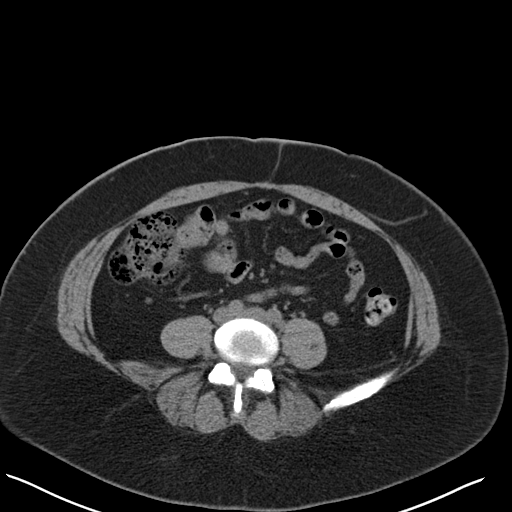
[im 53/96  soft-tissue]
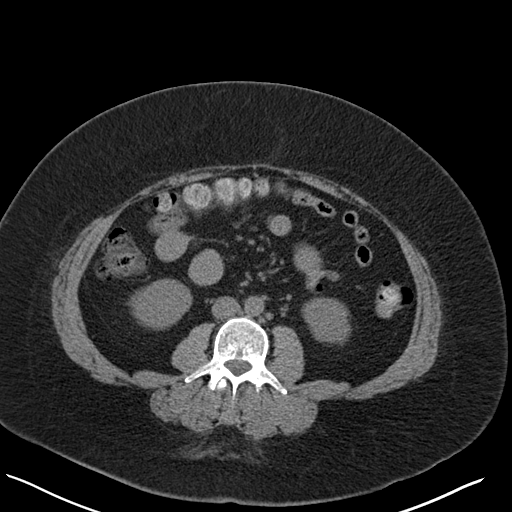
[im 59/96  soft-tissue]
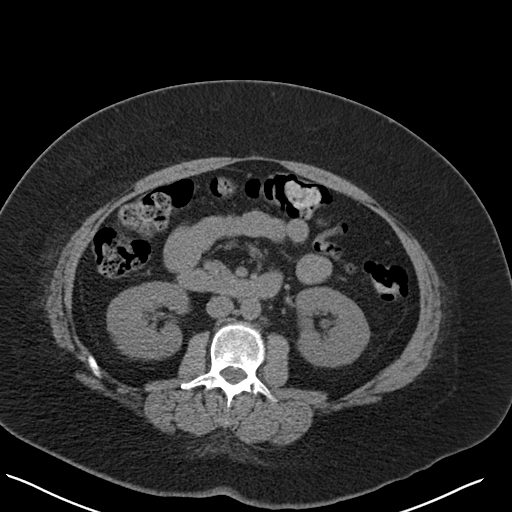
[im 69/96  soft-tissue]
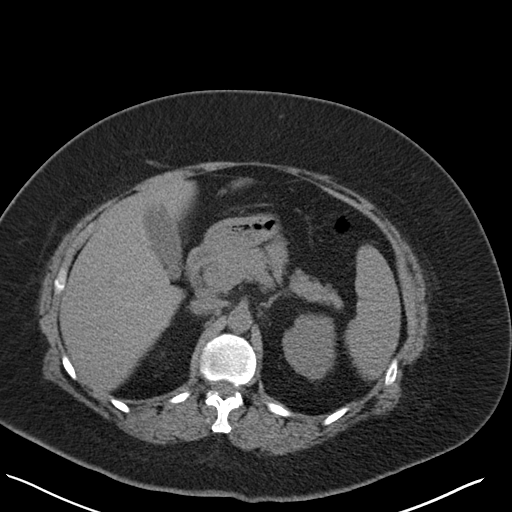
[im 69/96  bone]
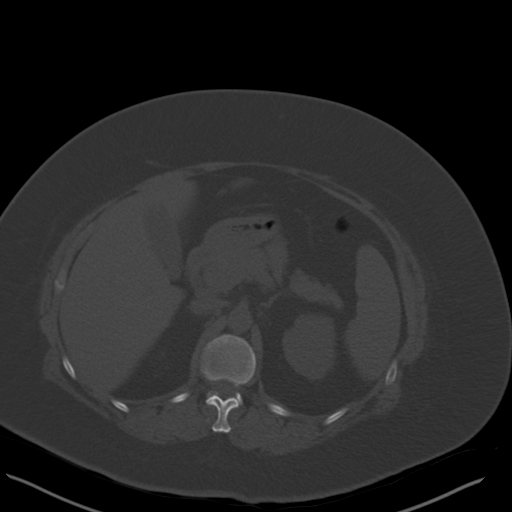
[im 74/96  soft-tissue]
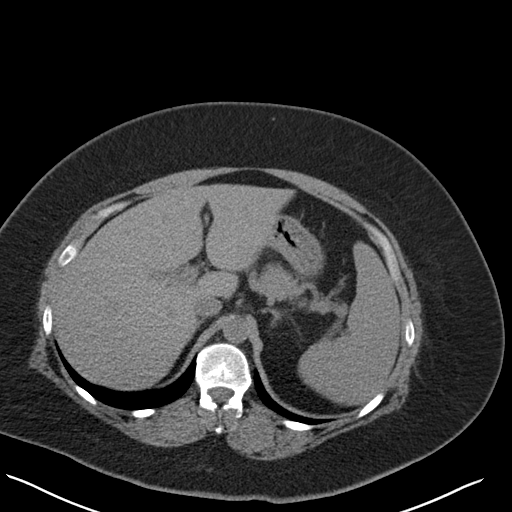
[im 80/96  soft-tissue]
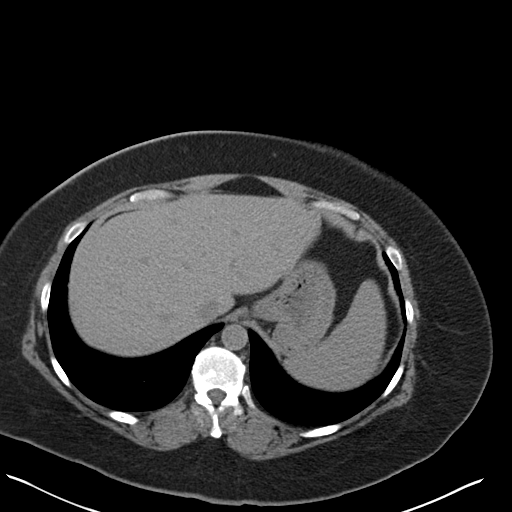
[im 90/96  soft-tissue]
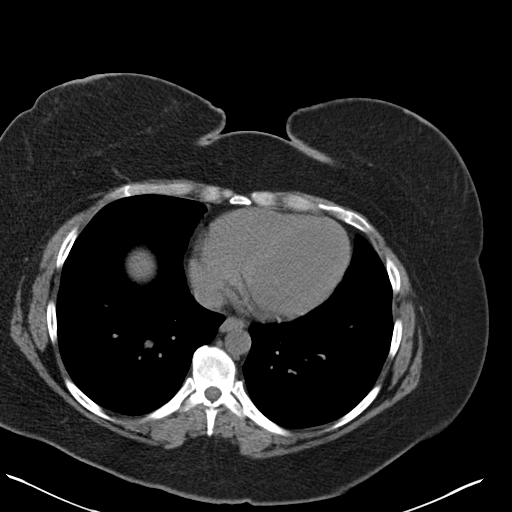

[Series 4: coronal · coronal · 0.73mm/px · 3 of 151 slices shown]
[im 51/151  soft-tissue]
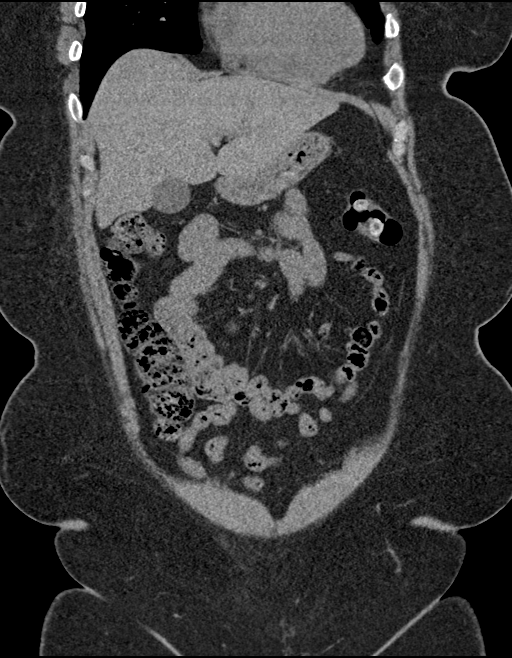
[im 67/151  soft-tissue]
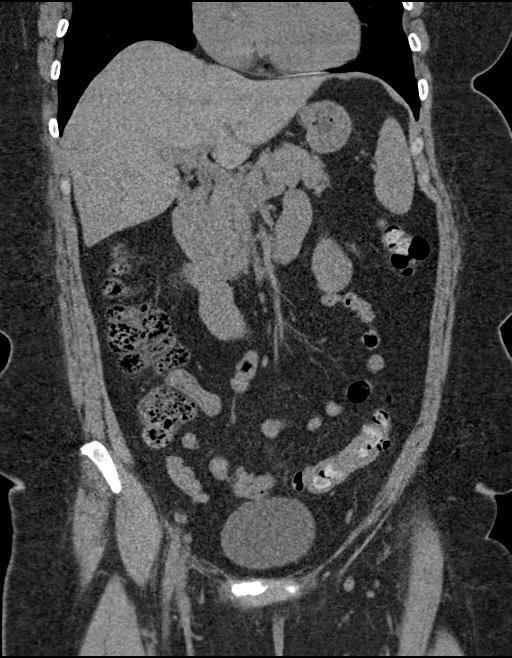
[im 84/151  soft-tissue]
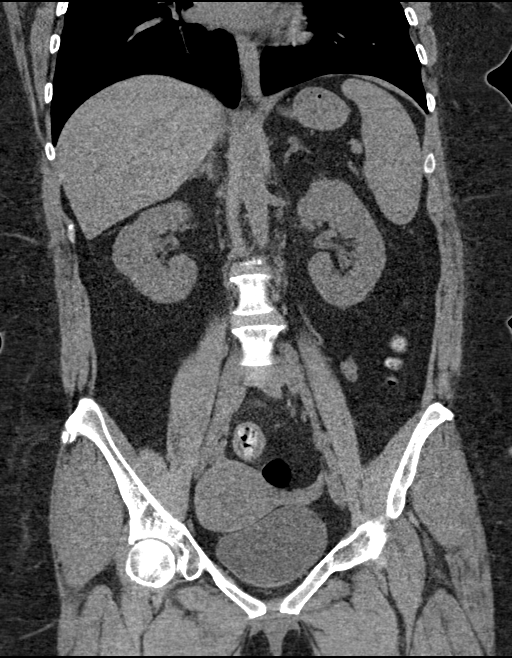

[15 of 46 positions shown; findings below may reference images not displayed]

FINDINGS: Lower chest: Unremarkable. Heart size is normal. No pericardial
effusion.

Lack of intra-articular fluid limits evaluation of the abdominal and
pelvic organ parenchyma. The following findings are made within this
limitation.

Hepatobiliary: Smooth liver contours. No gross liver lesion is
identified. The gallbladder is grossly unremarkable.

Pancreas: Unremarkable. No pancreatic ductal dilatation or
surrounding inflammatory changes.

Spleen: Normal in size without focal abnormality. A small splenule
is again seen just posterior to the spleen.

Adrenals/Urinary Tract: Normal adrenals. Resolution of the prior
right ureteropelvic junction stone with upstream mild ureterectasis
and hydronephrosis. No stone is seen within either ureter or either
kidney. No hydronephrosis within either kidney. The urinary bladder
is unremarkable. Within the limitations of lack of IV contrast, no
contour deforming renal mass.

Stomach/Bowel: Moderate stool is seen throughout the colon. No bowel
wall thickening. The terminal ileum is unremarkable. The appendix is
within normal limits. No dilated loops of bowel to indicate bowel
obstruction.

Vascular/Lymphatic: No abdominal aortic aneurysm. No enlarged
abdominal or pelvic lymph nodes.

Reproductive: The uterus is present and grossly unremarkable. No
adnexal masses.

Other: No abdominal wall hernia. No free air or free fluid.

Musculoskeletal: Severe L5-S1 disc space narrowing with endplate
sclerosis and peripheral osteophytosis, similar to prior.
IMPRESSION: 1. No nephroureterolithiasis or hydronephrosis. Resolution of the
prior right ureteropelvic junction stone.
2. Moderate stool throughout the colon compatible with constipation.
No bowel obstruction.

## 2023-05-25 ENCOUNTER — Ambulatory Visit (INDEPENDENT_AMBULATORY_CARE_PROVIDER_SITE_OTHER): Payer: No Typology Code available for payment source | Admitting: Family Medicine

## 2023-05-25 ENCOUNTER — Encounter: Payer: Self-pay | Admitting: Family Medicine

## 2023-05-25 VITALS — BP 134/83 | HR 78 | Temp 98.3°F | Resp 18 | Ht 64.5 in | Wt 238.5 lb

## 2023-05-25 DIAGNOSIS — R053 Chronic cough: Secondary | ICD-10-CM

## 2023-05-25 DIAGNOSIS — R7303 Prediabetes: Secondary | ICD-10-CM

## 2023-05-25 DIAGNOSIS — E02 Subclinical iodine-deficiency hypothyroidism: Secondary | ICD-10-CM | POA: Diagnosis not present

## 2023-05-25 DIAGNOSIS — I10 Essential (primary) hypertension: Secondary | ICD-10-CM | POA: Diagnosis not present

## 2023-05-25 DIAGNOSIS — F341 Dysthymic disorder: Secondary | ICD-10-CM

## 2023-05-25 MED ORDER — CARVEDILOL 6.25 MG PO TABS: 6.2500 mg | ORAL_TABLET | Freq: Two times a day (BID) | ORAL | 1 refills | Status: DC

## 2023-05-25 MED ORDER — TELMISARTAN-HCTZ 40-12.5 MG PO TABS: 1.0000 | ORAL_TABLET | Freq: Every day | ORAL | 1 refills | Status: DC

## 2023-05-25 NOTE — Assessment & Plan Note (Signed)
Chronic.  Better controlled.  Now managed by psych.  Continue Prozac, Wellbutrin, lamotrigine 200 mg.  Also in counseling
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
## 2023-05-25 NOTE — Assessment & Plan Note (Signed)
Subclinical.  Chronic.  Check thyroid function test.

## 2023-05-25 NOTE — Assessment & Plan Note (Signed)
Chronic.  Controlled.  Continue telmisartan HCT 40/12.5 mg and carvedilol 6.25 mg twice daily

## 2023-05-25 NOTE — Assessment & Plan Note (Signed)
Chronic.  Not sure if reactive airway disease, vocal cord dysfunction, other.  No improvement off of ACE-I.  Has seen pulmonary.  Did better on steroids, but returned off.  Was not aware that she needed to make a follow-up visit for last month.  Advised to call and get scheduled.

## 2023-05-25 NOTE — Patient Instructions (Signed)
Schedule w/Dr. Judeth Horn. For cough.

## 2023-05-25 NOTE — Assessment & Plan Note (Signed)
Chronic.  Working on diet/exercise.  Advise really needs to work on this.  She needs to make an effort at losing weight.  Will check A1c.

## 2023-05-26 LAB — COMPREHENSIVE METABOLIC PANEL
ALT: 12 U/L (ref 0–35)
AST: 13 U/L (ref 0–37)
Albumin: 4.2 g/dL (ref 3.5–5.2)
Alkaline Phosphatase: 71 U/L (ref 39–117)
BUN: 14 mg/dL (ref 6–23)
CO2: 27 meq/L (ref 19–32)
Calcium: 9.6 mg/dL (ref 8.4–10.5)
Chloride: 101 meq/L (ref 96–112)
Creatinine, Ser: 0.78 mg/dL (ref 0.40–1.20)
GFR: 91.44 mL/min (ref >60.00)
Glucose, Bld: 77 mg/dL (ref 70–99)
Potassium: 4 meq/L (ref 3.5–5.1)
Sodium: 139 meq/L (ref 135–145)
Total Bilirubin: 0.3 mg/dL (ref 0.2–1.2)
Total Protein: 6.9 g/dL (ref 6.0–8.3)

## 2023-05-26 LAB — T3, FREE: T3, Free: 3.5 pg/mL (ref 2.3–4.2)

## 2023-05-26 LAB — CBC WITH DIFFERENTIAL/PLATELET
Basophils Absolute: 0 10*3/uL (ref 0.0–0.1)
Basophils Relative: 0.4 % (ref 0.0–3.0)
Eosinophils Absolute: 0.1 10*3/uL (ref 0.0–0.7)
Eosinophils Relative: 1.1 % (ref 0.0–5.0)
HCT: 36.8 % (ref 36.0–46.0)
Hemoglobin: 11.8 g/dL — ABNORMAL LOW (ref 12.0–15.0)
Lymphocytes Relative: 18.8 % (ref 12.0–46.0)
Lymphs Abs: 1.4 10*3/uL (ref 0.7–4.0)
MCHC: 31.9 g/dL (ref 30.0–36.0)
MCV: 86.9 fL (ref 78.0–100.0)
Monocytes Absolute: 0.6 10*3/uL (ref 0.1–1.0)
Monocytes Relative: 8 % (ref 3.0–12.0)
Neutro Abs: 5.2 10*3/uL (ref 1.4–7.7)
Neutrophils Relative %: 71.7 % (ref 43.0–77.0)
Platelets: 308 10*3/uL (ref 150.0–400.0)
RBC: 4.24 Mil/uL (ref 3.87–5.11)
RDW: 14 % (ref 11.5–15.5)
WBC: 7.2 10*3/uL (ref 4.0–10.5)

## 2023-05-26 LAB — T4, FREE: Free T4: 0.72 ng/dL (ref 0.60–1.60)

## 2023-05-26 LAB — HEMOGLOBIN A1C: Hgb A1c MFr Bld: 5.7 % (ref 4.6–6.5)

## 2023-05-26 LAB — TSH: TSH: 4.35 u[IU]/mL (ref 0.35–5.50)

## 2023-05-26 NOTE — Progress Notes (Signed)
Labs are stable. 1.  Very slight anemia-does she have heavy periods? 2.  Thyroid is stable 3.A1C(3 month average of sugars) is elevated.  This is considered PreDiabetes.  Work on diet-decrease sugars and starches and aim for 30 minutes of exercise 5 days/week to prevent progression to diabetes   not worse

## 2023-05-28 ENCOUNTER — Encounter: Payer: Self-pay | Admitting: Obstetrics & Gynecology

## 2023-06-12 LAB — HM COLONOSCOPY

## 2023-06-19 ENCOUNTER — Encounter: Payer: Self-pay | Admitting: Podiatry

## 2023-06-19 ENCOUNTER — Ambulatory Visit (INDEPENDENT_AMBULATORY_CARE_PROVIDER_SITE_OTHER): Payer: No Typology Code available for payment source

## 2023-06-19 ENCOUNTER — Ambulatory Visit (INDEPENDENT_AMBULATORY_CARE_PROVIDER_SITE_OTHER): Payer: No Typology Code available for payment source | Admitting: Podiatry

## 2023-06-19 DIAGNOSIS — M7662 Achilles tendinitis, left leg: Secondary | ICD-10-CM

## 2023-06-19 DIAGNOSIS — M62462 Contracture of muscle, left lower leg: Secondary | ICD-10-CM

## 2023-06-19 DIAGNOSIS — M778 Other enthesopathies, not elsewhere classified: Secondary | ICD-10-CM

## 2023-06-19 DIAGNOSIS — M9262 Juvenile osteochondrosis of tarsus, left ankle: Secondary | ICD-10-CM

## 2023-06-19 DIAGNOSIS — Z01818 Encounter for other preprocedural examination: Secondary | ICD-10-CM | POA: Diagnosis not present

## 2023-06-19 DIAGNOSIS — M7661 Achilles tendinitis, right leg: Secondary | ICD-10-CM | POA: Diagnosis not present
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
## 2023-06-24 ENCOUNTER — Telehealth: Payer: Self-pay | Admitting: Podiatry

## 2023-06-24 NOTE — Telephone Encounter (Signed)
DOS- 07/20/2023  ACHILLES TENDON REPAIR [id number] CALCANEAL OSTECTOMY [id number] GASTROCNEMIUS RECESS [id number]  [employer] EFFECTIVE DATE- 08/18/2022  OOP-$3500.00 WITH REMAINING $1,411.77  COINSURANCE- 10%  SPOKE WITH ANETTE P. FROM [employer] AND SHE STATED THAT FOR CPT CODES 62130,86578 AND 46962 THAT PRIOR AUTH IS NOT REQUIRED.  CALL REFERENCE NUMBER: ANETTE P. 06/24/2023 @11 :41 AM EST

## 2023-06-30 ENCOUNTER — Telehealth: Payer: Self-pay

## 2023-06-30 ENCOUNTER — Telehealth: Payer: Self-pay | Admitting: Podiatry

## 2023-06-30 NOTE — Telephone Encounter (Signed)
Patient called and left a message - she got an email informing her that we have not received her paperwork for her surgery on 07/20/23 - please call to confirm whether we have everything or not Thanks

## 2023-06-30 NOTE — Telephone Encounter (Signed)
Patient called and left a message - she is wondering if it's ok for her husband to come by and pick up her form for a handicap placard. Called patient back and left a message that it is fine for her husband to come by to pick - Advised that I would let the front desk know -thanks

## 2023-07-06 ENCOUNTER — Telehealth: Payer: Self-pay | Admitting: Podiatry

## 2023-07-06 DIAGNOSIS — M79673 Pain in unspecified foot: Secondary | ICD-10-CM

## 2023-07-06 NOTE — Telephone Encounter (Signed)
NA

## 2023-07-06 NOTE — Telephone Encounter (Signed)
Completed STD paperwork for patient ....  Faxed to [place] today ....      J. Abbott -- 07/06/2023

## 2023-07-20 ENCOUNTER — Other Ambulatory Visit: Payer: Self-pay | Admitting: Podiatry

## 2023-07-20 DIAGNOSIS — M7662 Achilles tendinitis, left leg: Secondary | ICD-10-CM | POA: Diagnosis not present

## 2023-07-20 DIAGNOSIS — M216X2 Other acquired deformities of left foot: Secondary | ICD-10-CM | POA: Diagnosis not present

## 2023-07-20 DIAGNOSIS — M7732 Calcaneal spur, left foot: Secondary | ICD-10-CM | POA: Diagnosis not present

## 2023-07-20 HISTORY — PX: HEEL SPUR SURGERY: SHX665

## 2023-07-20 MED ORDER — OXYCODONE-ACETAMINOPHEN 5-325 MG PO TABS: 1.0000 | ORAL_TABLET | ORAL | 0 refills | Status: DC | PRN

## 2023-07-20 MED ORDER — IBUPROFEN 800 MG PO TABS: 800.0000 mg | ORAL_TABLET | Freq: Four times a day (QID) | ORAL | 1 refills | Status: DC | PRN

## 2023-07-29 ENCOUNTER — Encounter: Payer: Self-pay | Admitting: Podiatry

## 2023-07-29 ENCOUNTER — Ambulatory Visit (INDEPENDENT_AMBULATORY_CARE_PROVIDER_SITE_OTHER): Payer: No Typology Code available for payment source | Admitting: Podiatry

## 2023-07-29 ENCOUNTER — Ambulatory Visit (INDEPENDENT_AMBULATORY_CARE_PROVIDER_SITE_OTHER): Payer: No Typology Code available for payment source

## 2023-07-29 DIAGNOSIS — M62462 Contracture of muscle, left lower leg: Secondary | ICD-10-CM

## 2023-07-29 DIAGNOSIS — M9262 Juvenile osteochondrosis of tarsus, left ankle: Secondary | ICD-10-CM

## 2023-07-29 DIAGNOSIS — M7661 Achilles tendinitis, right leg: Secondary | ICD-10-CM

## 2023-07-29 DIAGNOSIS — M7662 Achilles tendinitis, left leg: Secondary | ICD-10-CM

## 2023-07-29 DIAGNOSIS — Z9889 Other specified postprocedural states: Secondary | ICD-10-CM
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
## 2023-08-05 ENCOUNTER — Telehealth: Payer: Self-pay | Admitting: Podiatry

## 2023-08-05 ENCOUNTER — Ambulatory Visit: Payer: No Typology Code available for payment source | Admitting: Pulmonary Disease

## 2023-08-05 ENCOUNTER — Other Ambulatory Visit: Payer: Self-pay

## 2023-08-05 ENCOUNTER — Encounter (HOSPITAL_COMMUNITY): Payer: Self-pay

## 2023-08-05 ENCOUNTER — Emergency Department (HOSPITAL_COMMUNITY)
Admission: EM | Admit: 2023-08-05 | Discharge: 2023-08-06 | Discharge status: Against Medical Advice | Payer: No Typology Code available for payment source | Attending: Emergency Medicine | Admitting: Emergency Medicine

## 2023-08-05 ENCOUNTER — Emergency Department (HOSPITAL_COMMUNITY): Payer: No Typology Code available for payment source

## 2023-08-05 DIAGNOSIS — S99912A Unspecified injury of left ankle, initial encounter: Secondary | ICD-10-CM | POA: Insufficient documentation

## 2023-08-05 DIAGNOSIS — Z5321 Procedure and treatment not carried out due to patient leaving prior to being seen by health care provider: Secondary | ICD-10-CM | POA: Insufficient documentation

## 2023-08-05 DIAGNOSIS — W19XXXA Unspecified fall, initial encounter: Secondary | ICD-10-CM | POA: Diagnosis not present

## 2023-08-05 MED ORDER — ONDANSETRON 4 MG PO TBDP
4.0000 mg | ORAL_TABLET | Freq: Once | ORAL | Status: AC
  Administered 2023-08-05: 4 mg via ORAL
  Filled 2023-08-05: qty 1

## 2023-08-05 MED ORDER — FENTANYL CITRATE PF 50 MCG/ML IJ SOSY
50.0000 ug | PREFILLED_SYRINGE | INTRAMUSCULAR | Status: DC | PRN
Start: 2023-08-05 — End: 2023-08-06
  Administered 2023-08-05: 50 ug via NASAL
  Filled 2023-08-05: qty 1

## 2023-08-05 NOTE — Telephone Encounter (Signed)
Patient called and stated that she has had Sx within the past three weeks and took a fall earlier today and feels as if she might've lost some staples. She stated she is in a great deal of pain and can not move her foot what so ever its painful to the touch.

## 2023-08-05 NOTE — ED Triage Notes (Signed)
Pt presents with L ankle injury from a fall. Pt had surgery on her L ankle on 12/2. Pt endorses multiple falls, but had a fall today that has caused significant pain. Pt last took 5 mg oxycodone at 1800.

## 2023-08-06 ENCOUNTER — Ambulatory Visit (INDEPENDENT_AMBULATORY_CARE_PROVIDER_SITE_OTHER): Payer: No Typology Code available for payment source | Admitting: Podiatry

## 2023-08-06 ENCOUNTER — Encounter: Payer: No Typology Code available for payment source | Admitting: Podiatry

## 2023-08-06 DIAGNOSIS — M7662 Achilles tendinitis, left leg: Secondary | ICD-10-CM

## 2023-08-06 DIAGNOSIS — T148XXA Other injury of unspecified body region, initial encounter: Secondary | ICD-10-CM

## 2023-08-06 DIAGNOSIS — M7661 Achilles tendinitis, right leg: Secondary | ICD-10-CM

## 2023-08-06 NOTE — Progress Notes (Signed)
Subjective:   Patient ID: Beth Holloway, female   DOB: 47 y.o.   MRN: [id number]   HPI Patient states she fell on her heel last night and she was concerned about the amount of discomfort she is having several weeks after having a posterior heel surgery by Dr. Allena Katz   ROS      Objective:  Physical Exam  Neurovascular status intact with trauma to the posterior aspect the left heel with several of the staples that have gotten loose and pain which is more in the proximal Achilles and hard for me to evaluate today because of the amount of intense discomfort she is having     Assessment:  Cannot rule out a tear of the Achilles secondary to the injury sustained last night with patient not able to dorsiflex plantarflex but may be due also to surgical procedure and I do not have the objective analysis of having done the surgery     Plan:  8 MP and we discussed that today new air fracture walker to hold her position better and she will see Dr. Allena Katz tomorrow who I want to evaluate her Achilles and make a decision on whether or not an MRI is necessary.  Patient has sterile dressing reapplied and reappoint to recheck

## 2023-08-07 ENCOUNTER — Ambulatory Visit (INDEPENDENT_AMBULATORY_CARE_PROVIDER_SITE_OTHER): Payer: No Typology Code available for payment source | Admitting: Podiatry

## 2023-08-07 ENCOUNTER — Ambulatory Visit: Payer: No Typology Code available for payment source | Admitting: Podiatry

## 2023-08-07 DIAGNOSIS — M7661 Achilles tendinitis, right leg: Secondary | ICD-10-CM

## 2023-08-07 DIAGNOSIS — M7662 Achilles tendinitis, left leg: Secondary | ICD-10-CM

## 2023-08-07 DIAGNOSIS — Z9889 Other specified postprocedural states: Secondary | ICD-10-CM
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
## 2023-08-13 ENCOUNTER — Encounter: Payer: No Typology Code available for payment source | Admitting: Podiatry

## 2023-08-17 ENCOUNTER — Telehealth: Payer: Self-pay

## 2023-08-17 NOTE — Telephone Encounter (Signed)
Patient called and left a message - she is returning to work on 08/31/23 and needs a note - her next follow up is not until 09/16/23 Beth Holloway pt)

## 2023-08-26 ENCOUNTER — Ambulatory Visit: Payer: No Typology Code available for payment source | Admitting: Family Medicine

## 2023-08-28 ENCOUNTER — Encounter: Payer: Self-pay | Admitting: Podiatry

## 2023-08-28 ENCOUNTER — Ambulatory Visit (INDEPENDENT_AMBULATORY_CARE_PROVIDER_SITE_OTHER): Payer: No Typology Code available for payment source | Admitting: Podiatry

## 2023-08-28 DIAGNOSIS — M7661 Achilles tendinitis, right leg: Secondary | ICD-10-CM

## 2023-08-28 DIAGNOSIS — M7662 Achilles tendinitis, left leg: Secondary | ICD-10-CM
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
## 2023-08-31 ENCOUNTER — Encounter: Payer: Self-pay | Admitting: Podiatry

## 2023-09-01 ENCOUNTER — Encounter: Payer: Self-pay | Admitting: Podiatry

## 2023-09-01 ENCOUNTER — Telehealth: Payer: Self-pay | Admitting: Podiatry

## 2023-09-01 NOTE — Telephone Encounter (Signed)
 Completed Return to Work Form from [place] .SABRA...   Created letter for the patient to allow light duty restrictions and constant sitting (per Dr, Tobie) to continue until 10/12/2023.  Placed a copy of the [place] documents and Dr's letter at the front desk in [place] -- patient wanted copy of both ....        J. Abbott -- 09/01/2023

## 2023-09-10 ENCOUNTER — Telehealth: Payer: Self-pay | Admitting: Podiatry

## 2023-09-10 NOTE — Telephone Encounter (Signed)
Completed RTW form from [place] ...  (See 09/01/23 notes - failed to add Dr.'s letter) ....   Restrictions listed on 09/01/2023 Dr's letter-- faxed the letter and revised RTW form to [place] -- [phone] .Marland Kitchen...     J. Abbott -- 09/10/2023

## 2023-09-15 ENCOUNTER — Encounter: Payer: Self-pay | Admitting: Podiatry

## 2023-09-16 ENCOUNTER — Ambulatory Visit: Payer: No Typology Code available for payment source | Admitting: Podiatry

## 2023-09-23 ENCOUNTER — Ambulatory Visit: Payer: No Typology Code available for payment source | Admitting: Pulmonary Disease

## 2023-09-23 ENCOUNTER — Encounter: Payer: Self-pay | Admitting: Pulmonary Disease

## 2023-09-23 VITALS — BP 124/83 | HR 91 | Ht 65.0 in | Wt 249.6 lb

## 2023-09-23 DIAGNOSIS — R0600 Dyspnea, unspecified: Secondary | ICD-10-CM

## 2023-09-23 DIAGNOSIS — R053 Chronic cough: Secondary | ICD-10-CM | POA: Diagnosis not present

## 2023-09-23 DIAGNOSIS — J4550 Severe persistent asthma, uncomplicated: Secondary | ICD-10-CM

## 2023-09-23 LAB — CBC WITH DIFFERENTIAL/PLATELET
Basophils Absolute: 0 10*3/uL (ref 0.0–0.1)
Basophils Relative: 0.4 % (ref 0.0–3.0)
Eosinophils Absolute: 0.1 10*3/uL (ref 0.0–0.7)
Eosinophils Relative: 1.3 % (ref 0.0–5.0)
HCT: 34.8 % — ABNORMAL LOW (ref 36.0–46.0)
Hemoglobin: 11.1 g/dL — ABNORMAL LOW (ref 12.0–15.0)
Lymphocytes Relative: 17.7 % (ref 12.0–46.0)
Lymphs Abs: 1.2 10*3/uL (ref 0.7–4.0)
MCHC: 32 g/dL (ref 30.0–36.0)
MCV: 87.3 fL (ref 78.0–100.0)
Monocytes Absolute: 0.4 10*3/uL (ref 0.1–1.0)
Monocytes Relative: 6.1 % (ref 3.0–12.0)
Neutro Abs: 5.1 10*3/uL (ref 1.4–7.7)
Neutrophils Relative %: 74.5 % (ref 43.0–77.0)
Platelets: 242 10*3/uL (ref 150.0–400.0)
RBC: 3.98 Mil/uL (ref 3.87–5.11)
RDW: 15.8 % — ABNORMAL HIGH (ref 11.5–15.5)
WBC: 6.9 10*3/uL (ref 4.0–10.5)

## 2023-09-23 MED ORDER — BREZTRI AEROSPHERE 160-9-4.8 MCG/ACT IN AERO: 2.0000 | INHALATION_SPRAY | Freq: Two times a day (BID) | RESPIRATORY_TRACT | 11 refills | Status: DC

## 2023-09-23 MED ORDER — OMEPRAZOLE 40 MG PO CPDR: 40.0000 mg | DELAYED_RELEASE_CAPSULE | Freq: Every day | ORAL | 1 refills | Status: DC

## 2023-09-23 NOTE — Patient Instructions (Addendum)
 Nice to see you again  Sorry the cough is not better  Since her seem to be triggers of dust or other environment, since prednisone  make things better albeit short-term, I think this is cough related to asthma.  I recommend resuming Breztri , 2 puffs in the morning 2 puffs in the evening, rinse your mouth out with water after every use.  I provided samples, each sample inhaler will last 1 week  Also I prescribed it.  If too expensive please see me a message and I will look for more cost effective solution.  I think we need to use biologic or injection medicines to treat this given not significant proved with inhalers.  We will get blood work today to evaluate the best medicine moving forward.  In case this could be related to heartburn or reflux, take omeprazole  1 tablet once daily 20 to 30 minutes before your breakfast.  I would use for the next 4 to 8 weeks.  I sent a referral to laryngologist, voice specialist at [facility] for further evaluation.  They are ENT doctors.  Return to clinic in 2 to 3 months or sooner as needed with Dr. Annella
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
## 2023-09-24 LAB — IGE: IgE (Immunoglobulin E), Serum: <2 kU/L (ref <=114)

## 2023-09-25 ENCOUNTER — Ambulatory Visit (INDEPENDENT_AMBULATORY_CARE_PROVIDER_SITE_OTHER): Payer: No Typology Code available for payment source | Admitting: Podiatry

## 2023-09-25 ENCOUNTER — Encounter: Payer: Self-pay | Admitting: Podiatry

## 2023-09-25 VITALS — Ht 65.0 in | Wt 249.6 lb

## 2023-09-25 DIAGNOSIS — Z9889 Other specified postprocedural states: Secondary | ICD-10-CM

## 2023-09-25 DIAGNOSIS — M7662 Achilles tendinitis, left leg: Secondary | ICD-10-CM

## 2023-09-25 DIAGNOSIS — M7661 Achilles tendinitis, right leg: Secondary | ICD-10-CM
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
## 2023-09-26 ENCOUNTER — Encounter: Payer: Self-pay | Admitting: Pulmonary Disease

## 2023-09-26 LAB — ALLERGEN PROFILE, PERENNIAL ALLERGEN IGE

## 2023-09-26 NOTE — Progress Notes (Signed)
 Can we start process for Tezspire, severe persistent asthma with ongoing symptoms despite inhalers. Thanks!

## 2023-09-28 ENCOUNTER — Other Ambulatory Visit (HOSPITAL_COMMUNITY): Payer: Self-pay

## 2023-09-28 ENCOUNTER — Telehealth: Payer: Self-pay | Admitting: Pharmacist

## 2023-09-28 NOTE — Telephone Encounter (Signed)
 Submitted a Prior Authorization request to [facility] for TEZSPIRE  via CoverMyMeds. Will update once we receive a response.  Key: Amador Bad, PharmD, MPH, BCPS, CPP Clinical Pharmacist (Rheumatology and Pulmonology)

## 2023-09-28 NOTE — Progress Notes (Signed)
 Pharmacy team will start Tezspire BIV

## 2023-09-28 NOTE — Telephone Encounter (Signed)
 Please start Tezspire  BIV  Dose: 210mg  SQ every 4 weeks Dx: J45.50 (severe persistent asthma)  Geraldene Kleine, PharmD, MPH, BCPS, CPP Clinical Pharmacist (Rheumatology and Pulmonology)

## 2023-09-29 ENCOUNTER — Telehealth: Payer: Self-pay | Admitting: Podiatry

## 2023-09-29 NOTE — Telephone Encounter (Signed)
Patient is requesting a letter for workplace stating there are no restrictions by provider.

## 2023-09-29 NOTE — Telephone Encounter (Signed)
Patient called wanted a doctors note stating that she can be off her restrictions at work. She said that she sent a message through MyChart. Thank you.

## 2023-09-30 ENCOUNTER — Telehealth: Payer: Self-pay | Admitting: Podiatry

## 2023-09-30 ENCOUNTER — Encounter: Payer: Self-pay | Admitting: Podiatry

## 2023-09-30 NOTE — Telephone Encounter (Signed)
Note typed out stating no restrictions. Letter was sent to [facility] and let her know she can pick up a printed copy in the office if need be.

## 2023-09-30 NOTE — Telephone Encounter (Signed)
Patient called stating she needs a work note stating she can come off all her restrictions.

## 2023-09-30 NOTE — Telephone Encounter (Signed)
Patient is requesting nurse to call regarding letter sent. Please contact Patient at [phone]

## 2023-10-06 ENCOUNTER — Encounter: Payer: Self-pay | Admitting: Pulmonary Disease

## 2023-10-06 NOTE — Telephone Encounter (Signed)
What is most affordable LAMA/LABA/ICS? What is most affordable ICS/LABA?

## 2023-10-07 NOTE — Telephone Encounter (Signed)
Received notification from [facility] regarding a prior authorization for TEZSPIRE. Authorization has been APPROVED from 10/02/2023 to 03/31/2024. Approval letter sent to scan center.  Unable to run test claim because patient must fill through  [facility]  Authorization # [id number] [employer] Phone: [phone]  Enrolled patient into Tezspire copay card: Confirmation number: [id number] ID: [id number] Group: [id number] BIN: [id number] PCN: [id number]  Expiration: October 05, 2026 Copay card phone: [phone]  Patient can be scheduled for Tezspire new start once sample supply confirmed  Chesley Mires, PharmD, MPH, BCPS, CPP Clinical Pharmacist (Rheumatology and Pulmonology)

## 2023-10-08 ENCOUNTER — Telehealth: Payer: Self-pay

## 2023-10-08 ENCOUNTER — Other Ambulatory Visit (HOSPITAL_COMMUNITY): Payer: Self-pay

## 2023-10-08 NOTE — Telephone Encounter (Signed)
Pharmacy Patient Advocate Encounter  Test claims requested for the following:   Triple Therapy  Breztri Trelegy  ICS/LABA  Advair HFA  Advair Diskus  Breo Ellipta Dulera Symbicort  Breyna Wixela Inhub   Unable to verify any insurance coverage for patient at this time.

## 2023-10-08 NOTE — Telephone Encounter (Signed)
Unable to pull any insurance coverage for patient in order to process any claims.

## 2023-10-09 ENCOUNTER — Encounter: Payer: Self-pay | Admitting: Family Medicine

## 2023-10-09 ENCOUNTER — Other Ambulatory Visit (HOSPITAL_COMMUNITY): Payer: Self-pay

## 2023-10-09 ENCOUNTER — Ambulatory Visit: Payer: No Typology Code available for payment source | Admitting: Family Medicine

## 2023-10-09 VITALS — BP 144/82 | HR 89 | Temp 98.2°F | Resp 18 | Ht 65.0 in | Wt 247.2 lb

## 2023-10-09 DIAGNOSIS — R053 Chronic cough: Secondary | ICD-10-CM

## 2023-10-09 DIAGNOSIS — R600 Localized edema: Secondary | ICD-10-CM

## 2023-10-09 DIAGNOSIS — E02 Subclinical iodine-deficiency hypothyroidism: Secondary | ICD-10-CM | POA: Diagnosis not present

## 2023-10-09 DIAGNOSIS — F341 Dysthymic disorder: Secondary | ICD-10-CM

## 2023-10-09 DIAGNOSIS — R7303 Prediabetes: Secondary | ICD-10-CM | POA: Diagnosis not present

## 2023-10-09 DIAGNOSIS — I10 Essential (primary) hypertension: Secondary | ICD-10-CM | POA: Diagnosis not present

## 2023-10-09 DIAGNOSIS — F411 Generalized anxiety disorder: Secondary | ICD-10-CM | POA: Diagnosis not present

## 2023-10-09 MED ORDER — TELMISARTAN-HCTZ 40-12.5 MG PO TABS: 1.0000 | ORAL_TABLET | Freq: Every day | ORAL | 1 refills | Status: DC

## 2023-10-09 MED ORDER — POTASSIUM CHLORIDE CRYS ER 10 MEQ PO TBCR: 10.0000 meq | EXTENDED_RELEASE_TABLET | Freq: Two times a day (BID) | ORAL | 1 refills | Status: DC | PRN

## 2023-10-09 MED ORDER — CARVEDILOL 6.25 MG PO TABS: 6.2500 mg | ORAL_TABLET | Freq: Two times a day (BID) | ORAL | 1 refills | Status: DC

## 2023-10-09 MED ORDER — FUROSEMIDE 20 MG PO TABS: 20.0000 mg | ORAL_TABLET | Freq: Every day | ORAL | 1 refills | Status: DC | PRN
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
## 2023-10-09 NOTE — Patient Instructions (Addendum)
It was very nice to see you today!  Breztri  and see if co-pay card.   Shots for weight loss-talk to gyn.    [email or web address]  Add lasix for few days for fluid and potassium   PLEASE NOTE:  If you had any lab tests please let us know if you have not heard back within a few days. You may see your results on MyChart before we have a chance to review them but we will give you a call once they are reviewed by [place]. If we ordered any referrals today, please let us know if you have not heard from their office within the next week.   Please try these tips to maintain a healthy lifestyle:  Eat most of your calories during the day when you are active. Eliminate processed foods including packaged sweets (pies, cakes, cookies), reduce intake of potatoes, white bread, white pasta, and white rice. Look for whole grain options, oat flour or almond flour.  Each meal should contain half fruits/vegetables, one quarter protein, and one quarter carbs (no bigger than a computer mouse).  Cut down on sweet beverages. This includes juice, soda, and sweet tea. Also watch fruit intake, though this is a healthier sweet option, it still contains natural sugar! Limit to 3 servings daily.  Drink at least 1 glass of water with each meal and aim for at least 8 glasses per day  Exercise at least 150 minutes every week.

## 2023-10-09 NOTE — Telephone Encounter (Signed)
Pt has listed insurance

## 2023-10-09 NOTE — Telephone Encounter (Signed)
Pharmacy [facility] [employer] information verified. Test claims for Triple Therapy and ICS/LABA options result in the following:   Breztri  $657.45 Trelegy  $670.22  Advair HFA (brand) $0.00 (generic) $0.00 Advair Diskus (brand) non-formulary (generic) $0.00 Breo Ellipta  $0.00 Dulera $339.37 Symbicort (brand) non-formulary (generic) $0.00 Wixela Inhub  $0.00 Breyna  $0.00

## 2023-10-10 ENCOUNTER — Encounter: Payer: Self-pay | Admitting: Family Medicine

## 2023-10-10 LAB — T3, FREE: T3, Free: 3.1 pg/mL (ref 2.3–4.2)

## 2023-10-10 LAB — CBC WITH DIFFERENTIAL/PLATELET
Absolute Lymphocytes: 1465 {cells}/uL (ref 850–3900)
Absolute Monocytes: 733 {cells}/uL (ref 200–950)
Basophils Absolute: 37 {cells}/uL (ref 0–200)
Basophils Relative: 0.5 %
Eosinophils Absolute: 163 {cells}/uL (ref 15–500)
Eosinophils Relative: 2.2 %
HCT: 33.3 % — ABNORMAL LOW (ref 35.0–45.0)
Hemoglobin: 10.8 g/dL — ABNORMAL LOW (ref 11.7–15.5)
MCH: 27.6 pg (ref 27.0–33.0)
MCHC: 32.4 g/dL (ref 32.0–36.0)
MCV: 85.2 fL (ref 80.0–100.0)
MPV: 10.1 fL (ref 7.5–12.5)
Monocytes Relative: 9.9 %
Neutro Abs: 5002 {cells}/uL (ref 1500–7800)
Neutrophils Relative %: 67.6 %
Platelets: 286 10*3/uL (ref 140–400)
RBC: 3.91 10*6/uL (ref 3.80–5.10)
RDW: 13.9 % (ref 11.0–15.0)
Total Lymphocyte: 19.8 %
WBC: 7.4 10*3/uL (ref 3.8–10.8)

## 2023-10-10 LAB — COMPREHENSIVE METABOLIC PANEL
AG Ratio: 1.8 (calc) (ref 1.0–2.5)
ALT: 12 U/L (ref 6–29)
AST: 12 U/L (ref 10–35)
Albumin: 4.1 g/dL (ref 3.6–5.1)
Alkaline phosphatase (APISO): 78 U/L (ref 31–125)
BUN: 16 mg/dL (ref 7–25)
CO2: 27 mmol/L (ref 20–32)
Calcium: 9.2 mg/dL (ref 8.6–10.2)
Chloride: 108 mmol/L (ref 98–110)
Creat: 0.72 mg/dL (ref 0.50–0.99)
Globulin: 2.3 g/dL (ref 1.9–3.7)
Glucose, Bld: 93 mg/dL (ref 65–99)
Potassium: 4.3 mmol/L (ref 3.5–5.3)
Sodium: 142 mmol/L (ref 135–146)
Total Bilirubin: 0.2 mg/dL (ref 0.2–1.2)
Total Protein: 6.4 g/dL (ref 6.1–8.1)

## 2023-10-10 LAB — HEMOGLOBIN A1C
Hgb A1c MFr Bld: 5.6 %{Hb} (ref <5.7)
Mean Plasma Glucose: 114 mg/dL
eAG (mmol/L): 6.3 mmol/L

## 2023-10-10 LAB — T4, FREE: Free T4: 0.9 ng/dL (ref 0.8–1.8)

## 2023-10-10 LAB — TSH: TSH: 4.37 m[IU]/L

## 2023-10-10 NOTE — Progress Notes (Signed)
 Anemia a little worse again.  Colonoscopy neg in Oct.  Menses irreg.  Is she taking iron and B12?   Something we need to monitor

## 2023-10-10 NOTE — Assessment & Plan Note (Signed)
 Chronic.  Controlled.  Continue telmisartan HCT 40/12.5 mg and carvedilol 6.25 mg twice daily

## 2023-10-10 NOTE — Assessment & Plan Note (Signed)
 Chronic.  Better controlled.  Now managed by psych.  Continue Prozac,  lamotrigine 200 mg.  Also in counseling

## 2023-10-10 NOTE — Assessment & Plan Note (Signed)
 Chronic.  Not sure if reactive airway disease, vocal cord dysfunction, other.  Has seen pulmonary.  Did better on steroids, but returned off.  On Breztri but ins not cover(try co-pay cards).  Awaiting appt w/ENT

## 2023-10-10 NOTE — Assessment & Plan Note (Signed)
 Subclinical.  Chronic.  Check thyroid function test.

## 2023-10-10 NOTE — Assessment & Plan Note (Signed)
 Chronic.  Working on diet/exercise.  Advise really needs to work on this.  Limited exercise d/t foot pain. Will check A1c.

## 2023-10-12 ENCOUNTER — Other Ambulatory Visit (HOSPITAL_COMMUNITY): Payer: Self-pay

## 2023-10-12 NOTE — Telephone Encounter (Signed)
 Test claims on encounter from 02/20

## 2023-10-12 NOTE — Telephone Encounter (Signed)
 Spoke with patient about Beth Holloway new start. She requested call back later rhis week to schedule (she has to look at her work schedule)

## 2023-10-16 NOTE — Telephone Encounter (Signed)
 Spoke with patient regarding Beth Holloway new start. She states she is unable to come to office during any business hours. She would only be able to come on Saturdays. She also states that she is in negative for vacation hours due to her many doctors' appointment.  This is the first time she'd be on a biologic for asthma, so I advised that we generally complete first dose in clinic to ensure safety monitoring and comprehensive education.  Nevertheless, will see if Dr. Judeth Horn has any thoughts on patient starting at home.  She states she may call back once she is able to take time off of work in future  Chesley Mires, PharmD, MPH, BCPS, CPP Clinical Pharmacist (Rheumatology and Pulmonology)

## 2023-10-28 ENCOUNTER — Ambulatory Visit (INDEPENDENT_AMBULATORY_CARE_PROVIDER_SITE_OTHER): Payer: No Typology Code available for payment source | Admitting: Family Medicine

## 2023-10-28 ENCOUNTER — Encounter: Payer: Self-pay | Admitting: Family Medicine

## 2023-10-28 VITALS — BP 124/78 | HR 92 | Temp 97.5°F | Resp 18 | Ht 65.0 in | Wt 248.5 lb

## 2023-10-28 DIAGNOSIS — D500 Iron deficiency anemia secondary to blood loss (chronic): Secondary | ICD-10-CM

## 2023-10-28 DIAGNOSIS — I10 Essential (primary) hypertension: Secondary | ICD-10-CM

## 2023-10-28 DIAGNOSIS — R600 Localized edema: Secondary | ICD-10-CM | POA: Diagnosis not present

## 2023-10-28 LAB — IBC + FERRITIN
Ferritin: 14.8 ng/mL (ref 10.0–291.0)
Iron: 172 ug/dL — ABNORMAL HIGH (ref 42–145)
Saturation Ratios: 39.3 % (ref 20.0–50.0)
TIBC: 438.2 ug/dL (ref 250.0–450.0)
Transferrin: 313 mg/dL (ref 212.0–360.0)

## 2023-10-28 LAB — CBC WITH DIFFERENTIAL/PLATELET
Basophils Absolute: 0 10*3/uL (ref 0.0–0.1)
Basophils Relative: 0.3 % (ref 0.0–3.0)
Eosinophils Absolute: 0.1 10*3/uL (ref 0.0–0.7)
Eosinophils Relative: 1.8 % (ref 0.0–5.0)
HCT: 35.9 % — ABNORMAL LOW (ref 36.0–46.0)
Hemoglobin: 11.6 g/dL — ABNORMAL LOW (ref 12.0–15.0)
Lymphocytes Relative: 17.5 % (ref 12.0–46.0)
Lymphs Abs: 1.2 10*3/uL (ref 0.7–4.0)
MCHC: 32.3 g/dL (ref 30.0–36.0)
MCV: 86.9 fl (ref 78.0–100.0)
Monocytes Absolute: 0.4 10*3/uL (ref 0.1–1.0)
Monocytes Relative: 6.2 % (ref 3.0–12.0)
Neutro Abs: 5.2 10*3/uL (ref 1.4–7.7)
Neutrophils Relative %: 74.2 % (ref 43.0–77.0)
Platelets: 255 10*3/uL (ref 150.0–400.0)
RBC: 4.13 Mil/uL (ref 3.87–5.11)
RDW: 15.1 % (ref 11.5–15.5)
WBC: 6.9 10*3/uL (ref 4.0–10.5)

## 2023-10-28 LAB — BASIC METABOLIC PANEL
BUN: 17 mg/dL (ref 6–23)
CO2: 25 meq/L (ref 19–32)
Calcium: 9.3 mg/dL (ref 8.4–10.5)
Chloride: 103 meq/L (ref 96–112)
Creatinine, Ser: 0.61 mg/dL (ref 0.40–1.20)
GFR: 107.31 mL/min (ref >60.00)
Glucose, Bld: 171 mg/dL — ABNORMAL HIGH (ref 70–99)
Potassium: 3.6 meq/L (ref 3.5–5.1)
Sodium: 137 meq/L (ref 135–145)

## 2023-10-28 LAB — FOLATE: Folate: 18.2 ng/mL (ref >5.9)

## 2023-10-28 LAB — VITAMIN B12: Vitamin B-12: 638 pg/mL (ref 211–911)

## 2023-10-28 NOTE — Patient Instructions (Signed)
 Compression stockings.
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
## 2023-10-29 ENCOUNTER — Encounter: Payer: Self-pay | Admitting: Family Medicine

## 2023-10-29 NOTE — Progress Notes (Signed)
 Anemia is improving.  Continue iron daily for 1 month, then change to every other day

## 2023-11-22 ENCOUNTER — Other Ambulatory Visit: Payer: Self-pay | Admitting: Pulmonary Disease

## 2023-12-01 ENCOUNTER — Encounter: Payer: Self-pay | Admitting: Family Medicine

## 2023-12-02 NOTE — Telephone Encounter (Unsigned)
 Copied from CRM [id number]. Topic: General - Other >> Dec 02, 2023 10:03 AM Beth Holloway wrote: Reason for CRM: Patient sent over message yesterday regarding tick and she was wondering when someone will reach out. Please give patient a call back at [phone]

## 2023-12-03 ENCOUNTER — Encounter: Payer: Self-pay | Admitting: Physician Assistant

## 2023-12-03 ENCOUNTER — Ambulatory Visit (INDEPENDENT_AMBULATORY_CARE_PROVIDER_SITE_OTHER): Admitting: Physician Assistant

## 2023-12-03 VITALS — BP 126/80 | HR 72 | Temp 98.2°F | Ht 65.0 in | Wt 248.2 lb

## 2023-12-03 DIAGNOSIS — L03312 Cellulitis of back [any part except buttock]: Secondary | ICD-10-CM | POA: Diagnosis not present

## 2023-12-03 DIAGNOSIS — W57XXXA Bitten or stung by nonvenomous insect and other nonvenomous arthropods, initial encounter: Secondary | ICD-10-CM

## 2023-12-03 DIAGNOSIS — S20469D Insect bite (nonvenomous) of unspecified back wall of thorax, subsequent encounter: Secondary | ICD-10-CM

## 2023-12-03 MED ORDER — DOXYCYCLINE HYCLATE 100 MG PO TABS: 100.0000 mg | ORAL_TABLET | Freq: Two times a day (BID) | ORAL | 0 refills | Status: DC

## 2023-12-03 NOTE — Patient Instructions (Signed)
 It was great to see you!  Start doxycycline for at least 5 days, may take up to 2 weeks  Start OTC (available over the counter without a prescription) hydrocortisone cream "anti-itch" cream to apply to area Keep covered when wearing bra  Call/message with any concerns  Take care,  Alexander Iba PA-C
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
## 2023-12-17 ENCOUNTER — Encounter: Payer: Self-pay | Admitting: Pulmonary Disease

## 2023-12-17 ENCOUNTER — Ambulatory Visit: Payer: No Typology Code available for payment source | Admitting: Pulmonary Disease

## 2023-12-17 VITALS — BP 130/86 | HR 89 | Ht 65.0 in | Wt 258.2 lb

## 2023-12-17 DIAGNOSIS — R0602 Shortness of breath: Secondary | ICD-10-CM

## 2023-12-17 DIAGNOSIS — Z7189 Other specified counseling: Secondary | ICD-10-CM

## 2023-12-17 DIAGNOSIS — R053 Chronic cough: Secondary | ICD-10-CM | POA: Diagnosis not present

## 2023-12-17 DIAGNOSIS — J4550 Severe persistent asthma, uncomplicated: Secondary | ICD-10-CM

## 2023-12-17 MED ORDER — EPINEPHRINE 0.3 MG/0.3ML IJ SOAJ: 0.3000 mg | Freq: Once | INTRAMUSCULAR | 1 refills | Status: DC | PRN

## 2023-12-17 MED ORDER — TEZSPIRE 210 MG/1.91ML ~~LOC~~ SOAJ: 210.0000 mg | SUBCUTANEOUS | 5 refills | Status: DC

## 2023-12-17 NOTE — Patient Instructions (Signed)
 Your next TEZSPIRE  dose is due on 01/14/24, 02/11/24, and every 4 weeks thereafter  CONTINUE Breztri  160-9-4.74mcg (2 puffs twice daily)  Your prescription will be shipped from [facility]. Their phone number is [phone] Please call to schedule shipment and confirm address. They will mail your medication to your home.  Your copay should be affordable. If you call the pharmacy and it is not affordable, please double-check that they are billing through your copay card as secondary coverage. That copay card information is: ID: [id number] Group: [id number] BIN: [id number] PCN: [id number]  Expiration: October 05, 2026 Copay card phone: [phone]  You will need to be seen by your provider in 3 to 4 months to assess how TEZSPIRE  is working for you. Please ensure you have a follow-up appointment scheduled in August or September 2025. Call our clinic if you need to make this appointment.  Stay up to date on all routine vaccines: influenza, pneumonia, COVID19, Shingles  How to manage an injection site reaction: Remember the 5 C's: COUNTER - leave on the counter at least 30 minutes but up to overnight to bring medication to room temperature. This may help prevent stinging COLD - place something cold (like an ice gel pack or cold water bottle) on the injection site just before cleansing with alcohol. This may help reduce pain CLARITIN - use Claritin (generic name is loratadine) for the first two weeks of treatment or the day of, the day before, and the day after injecting. This will help to minimize injection site reactions CORTISONE CREAM - apply if injection site is irritated and itching CALL ME - if injection site reaction is bigger than the size of your fist, looks infected, blisters, or if you develop hives

## 2023-12-17 NOTE — Progress Notes (Unsigned)
 HPI Patient presents today to [facility] to see pharmacy team for Tezspire  new start per request of Dr. Marygrace Snellen. 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 Patient has busy work schedule and otherwise unable to accommodate a new start visit with pharmacy team. Denies any insurance changes since February 2025  Respiratory Medications Current regimen: Breztri  160-9-4.8 MCG/ACT (2 puffs twice daily) Patient reports no known adherence challenges  OBJECTIVE Allergies  Allergen Reactions   Codeine  Nausea And Vomiting    Other reaction(s): vomiting    Outpatient Encounter Medications as of 12/17/2023  Medication Sig   albuterol  (VENTOLIN  HFA) 108 (90 Base) MCG/ACT inhaler Inhale 2 puffs into the lungs every 6 (six) hours as needed for wheezing or shortness of breath.   Budeson-Glycopyrrol-Formoterol (BREZTRI  AEROSPHERE) 160-9-4.8 MCG/ACT AERO Inhale 2 puffs into the lungs in the morning and at bedtime.   carvedilol  (COREG ) 6.25 MG tablet Take 1 tablet (6.25 mg total) by mouth 2 (two) times daily with a meal.   cyanocobalamin  (VITAMIN B12) 1000 MCG tablet Take 1,000 mcg by mouth daily.   doxycycline  (VIBRA -TABS) 100 MG tablet Take 1 tablet (100 mg total) by mouth 2 (two) times daily.   EPINEPHrine  0.3 mg/0.3 mL IJ SOAJ injection Inject 0.3 mg into the muscle once as needed for up to 1 dose for anaphylaxis.   ferrous sulfate 325 (65 FE) MG tablet Take 325 mg by mouth daily.   FLUoxetine HCl (PROZAC PO) Take by mouth.   furosemide  (LASIX ) 20 MG tablet Take 1 tablet (20 mg total) by mouth daily as needed.   hydrOXYzine  (VISTARIL ) 25 MG capsule Take 1 capsule (25 mg total) by mouth every 8 (eight) hours as needed.   ibuprofen  (ADVIL ) 800 MG tablet Take 1 tablet (800 mg total) by mouth every 6 (six) hours as needed.   lamoTRIgine  (LAMICTAL ) 200 MG tablet Take 1 tablet (200 mg total) by mouth daily.   omeprazole  (PRILOSEC) 40 MG capsule TAKE 1 CAPSULE BY  MOUTH DAILY   potassium chloride  (KLOR-CON  M) 10 MEQ tablet Take 1 tablet (10 mEq total) by mouth 2 (two) times daily as needed. If take furosemide    telmisartan -hydrochlorothiazide  (MICARDIS  HCT) 40-12.5 MG tablet Take 1 tablet by mouth daily.   Tezepelumab -ekko (TEZSPIRE ) 210 MG/1. SOAJ Inject 210 mg into the skin every 28 (twenty-eight) days.   valACYclovir  (VALTREX ) 500 MG tablet Take 1 tablet (500 mg total) by mouth daily.   [DISCONTINUED] lisinopril -hydrochlorothiazide  (ZESTORETIC ) 20-12.5 MG tablet Take 1 tablet by mouth daily.   No facility-administered encounter medications on file as of 12/17/2023.     Immunization History  Administered Date(s) Administered   Dtap, Unspecified 05/13/1979, 01/02/1983   Influenza,inj,Quad PF,6+ Mos 06/17/2018   Influenza,trivalent, recombinat, inj, PF 06/20/2023   MMR 10/12/1978   Polio, Unspecified 05/13/1979, 01/02/1983   Tdap 08/22/2017     PFTs     No data to display          Eosinophils Most recent blood eosinophil count was 100 cells/microL taken on 10/28/23.   IgE: <2 on 09/23/2023  Assessment   Biologics training for tezepulumab (Tezspire )  Goals of therapy: Mechanism: human monoclonal IgG2? antibody that binds to TSLP. This blocks TSLP from its effect on inflammation including reduce eosinophils, IgE, FeNO, IL-5, and IL-13. Mechanism is not definitively established. Reviewed that Tezspire  is add-on medication and patient must continue maintenance inhaler regimen. Response to therapy: may take 3-4 months to determine efficacy.  Side effects: injection site reaction (6-18%), antibody development (  2%), arthralgia (4%), back pain (4%), pharyngitis (4%)  Dose: Tezspire  210 mg once every 4 weeks  Administration/Storage:  Reviewed administration sites of thigh or abdomen (at least 2-3 inches away from abdomen).  Access: Approval of Tezspire  through: insurance Patient enrolled into copay card program to help with copay  assistance.  Patient self-administered Tezspire  210mg /1.91 ml in left lower abdomen using sample Tezspire  210mg /1.91 ml Autoinjector pen NDC: [phone] Lot: [id number] Expiration: 11/15/2024  Patient was not monitored (d/w Dr. Marygrace Snellen) due to another appt after this one. Patient tolerated well with no discomfort. We reviewed injection site reaction management and monitoring of site. Printed injection site reaction management in AVS directions for reference later  Medication Reconciliation  A drug regimen assessment was performed, including review of allergies, interactions, disease-state management, dosing and immunization history. Medications were reviewed with the patient, including name, instructions, indication, goals of therapy, potential side effects, importance of adherence, and safe use.  Drug interaction(s): none noted  PLAN Continue Tezspire  210mg  SQ every 28 days.  Rx sent to:  [facility] [phone]) .  Patient provided with copay card information to help reduce copay and advised to call specialty pharmacy next week at the earliest to schedule shipment of Tezspire  to home Continue maintenance asthma regimen of: Breztri  160-9-4.8 MCG/ACT (2 puffs twice daily)  All questions encouraged and answered.  Instructed patient to reach out with any further questions or concerns.  Thank you for allowing pharmacy to participate in this patient's care.  Geraldene Kleine, PharmD, MPH, BCPS, CPP Clinical Pharmacist (Rheumatology and Pulmonology)

## 2023-12-22 ENCOUNTER — Encounter: Payer: Self-pay | Admitting: Pulmonary Disease
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
## 2024-02-02 ENCOUNTER — Ambulatory Visit (INDEPENDENT_AMBULATORY_CARE_PROVIDER_SITE_OTHER): Admitting: Family Medicine

## 2024-02-02 ENCOUNTER — Encounter: Payer: Self-pay | Admitting: Family Medicine

## 2024-02-02 VITALS — BP 127/73 | HR 83 | Temp 98.2°F | Resp 18 | Ht 65.0 in | Wt 256.2 lb

## 2024-02-02 DIAGNOSIS — R7303 Prediabetes: Secondary | ICD-10-CM

## 2024-02-02 DIAGNOSIS — F4310 Post-traumatic stress disorder, unspecified: Secondary | ICD-10-CM | POA: Diagnosis not present

## 2024-02-02 DIAGNOSIS — E7800 Pure hypercholesterolemia, unspecified: Secondary | ICD-10-CM | POA: Diagnosis not present

## 2024-02-02 DIAGNOSIS — K219 Gastro-esophageal reflux disease without esophagitis: Secondary | ICD-10-CM

## 2024-02-02 DIAGNOSIS — R531 Weakness: Secondary | ICD-10-CM | POA: Diagnosis not present

## 2024-02-02 DIAGNOSIS — I10 Essential (primary) hypertension: Secondary | ICD-10-CM

## 2024-02-02 DIAGNOSIS — D500 Iron deficiency anemia secondary to blood loss (chronic): Secondary | ICD-10-CM | POA: Diagnosis not present

## 2024-02-02 DIAGNOSIS — E02 Subclinical iodine-deficiency hypothyroidism: Secondary | ICD-10-CM | POA: Diagnosis not present

## 2024-02-02 LAB — COMPREHENSIVE METABOLIC PANEL WITH GFR
ALT: 11 U/L (ref 0–35)
AST: 11 U/L (ref 0–37)
Albumin: 4 g/dL (ref 3.5–5.2)
Alkaline Phosphatase: 72 U/L (ref 39–117)
BUN: 18 mg/dL (ref 6–23)
CO2: 29 meq/L (ref 19–32)
Calcium: 8.9 mg/dL (ref 8.4–10.5)
Chloride: 104 meq/L (ref 96–112)
Creatinine, Ser: 0.66 mg/dL (ref 0.40–1.20)
GFR: 105.09 mL/min (ref >60.00)
Glucose, Bld: 113 mg/dL — ABNORMAL HIGH (ref 70–99)
Potassium: 4.1 meq/L (ref 3.5–5.1)
Sodium: 140 meq/L (ref 135–145)
Total Bilirubin: 0.3 mg/dL (ref 0.2–1.2)
Total Protein: 6.3 g/dL (ref 6.0–8.3)

## 2024-02-02 LAB — VITAMIN B12: Vitamin B-12: 378 pg/mL (ref 211–911)

## 2024-02-02 LAB — T4, FREE: Free T4: 0.64 ng/dL (ref 0.60–1.60)

## 2024-02-02 LAB — CBC WITH DIFFERENTIAL/PLATELET
Basophils Absolute: 0 10*3/uL (ref 0.0–0.1)
Basophils Relative: 0.5 % (ref 0.0–3.0)
Eosinophils Absolute: 0.1 10*3/uL (ref 0.0–0.7)
Eosinophils Relative: 1.2 % (ref 0.0–5.0)
HCT: 32.1 % — ABNORMAL LOW (ref 36.0–46.0)
Hemoglobin: 10.1 g/dL — ABNORMAL LOW (ref 12.0–15.0)
Lymphocytes Relative: 18.7 % (ref 12.0–46.0)
Lymphs Abs: 1.2 10*3/uL (ref 0.7–4.0)
MCHC: 31.6 g/dL (ref 30.0–36.0)
MCV: 86.7 fl (ref 78.0–100.0)
Monocytes Absolute: 0.5 10*3/uL (ref 0.1–1.0)
Monocytes Relative: 7.4 % (ref 3.0–12.0)
Neutro Abs: 4.4 10*3/uL (ref 1.4–7.7)
Neutrophils Relative %: 72.2 % (ref 43.0–77.0)
Platelets: 258 10*3/uL (ref 150.0–400.0)
RBC: 3.7 Mil/uL — ABNORMAL LOW (ref 3.87–5.11)
RDW: 14.5 % (ref 11.5–15.5)
WBC: 6.2 10*3/uL (ref 4.0–10.5)

## 2024-02-02 LAB — LIPID PANEL
Cholesterol: 209 mg/dL — ABNORMAL HIGH (ref 0–200)
HDL: 61.1 mg/dL (ref >39.00)
LDL Cholesterol: 133 mg/dL — ABNORMAL HIGH (ref 0–99)
NonHDL: 147.57
Total CHOL/HDL Ratio: 3
Triglycerides: 73 mg/dL (ref 0.0–149.0)
VLDL: 14.6 mg/dL (ref 0.0–40.0)

## 2024-02-02 LAB — IBC + FERRITIN
Ferritin: 8.4 ng/mL — ABNORMAL LOW (ref 10.0–291.0)
Iron: 59 ug/dL (ref 42–145)
Saturation Ratios: 13.8 % — ABNORMAL LOW (ref 20.0–50.0)
TIBC: 428.4 ug/dL (ref 250.0–450.0)
Transferrin: 306 mg/dL (ref 212.0–360.0)

## 2024-02-02 LAB — HEMOGLOBIN A1C: Hgb A1c MFr Bld: 6.2 % (ref 4.6–6.5)

## 2024-02-02 LAB — VITAMIN D 25 HYDROXY (VIT D DEFICIENCY, FRACTURES): VITD: 16.65 ng/mL — ABNORMAL LOW (ref 30.00–100.00)

## 2024-02-02 LAB — T3, FREE: T3, Free: 4 pg/mL (ref 2.3–4.2)

## 2024-02-02 LAB — CORTISOL: Cortisol, Plasma: 6 ug/dL

## 2024-02-02 LAB — SEDIMENTATION RATE: Sed Rate: 24 mm/h — ABNORMAL HIGH (ref 0–20)

## 2024-02-02 LAB — TSH: TSH: 4.19 u[IU]/mL (ref 0.35–5.50)

## 2024-02-02 MED ORDER — OMEPRAZOLE 40 MG PO CPDR: 40.0000 mg | DELAYED_RELEASE_CAPSULE | Freq: Every day | ORAL | 1 refills | Status: DC

## 2024-02-02 NOTE — Patient Instructions (Addendum)
 It was very nice to see you today!  Talk to pulmonary about sleep apnea.  Try to get sooner appt.   [facility]  [address] on the 1st floor Phone number [phone]    PLEASE NOTE:  If you had any lab tests please let us  know if you have not heard back within a few days. You may see your results on MyChart before we have a chance to review them but we will give you a call once they are reviewed by us . If we ordered any referrals today, please let us  know if you have not heard from their office within the next week.   Please try these tips to maintain a healthy lifestyle:  Eat most of your calories during the day when you are active. Eliminate processed foods including packaged sweets (pies, cakes, cookies), reduce intake of potatoes, white bread, white pasta, and white rice. Look for whole grain options, oat flour or almond flour.  Each meal should contain half fruits/vegetables, one quarter protein, and one quarter carbs (no bigger than a computer mouse).  Cut down on sweet beverages. This includes juice, soda, and sweet tea. Also watch fruit intake, though this is a healthier sweet option, it still contains natural sugar! Limit to 3 servings daily.  Drink at least 1 glass of water with each meal and aim for at least 8 glasses per day  Exercise at least 150 minutes every week.
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
## 2024-02-03 LAB — ANA W/REFLEX IF POSITIVE
Anti JO-1: <0.2 AI (ref 0.0–0.9)
Anti Nuclear Antibody (ANA): POSITIVE — AB
Centromere Ab Screen: 4.7 AI — ABNORMAL HIGH (ref 0.0–0.9)
Chromatin Ab SerPl-aCnc: <0.2 AI (ref 0.0–0.9)
ENA RNP Ab: <0.2 AI (ref 0.0–0.9)
ENA SM Ab Ser-aCnc: <0.2 AI (ref 0.0–0.9)
ENA SSA (RO) Ab: 0.2 AI (ref 0.0–0.9)
ENA SSB (LA) Ab: <0.2 AI (ref 0.0–0.9)
Scleroderma (Scl-70) (ENA) Antibody, IgG: <0.2 AI (ref 0.0–0.9)
dsDNA Ab: <1 [IU]/mL (ref 0–9)

## 2024-02-03 LAB — RHEUMATOID FACTOR: Rheumatoid fact SerPl-aCnc: <10 [IU]/mL

## 2024-02-04 ENCOUNTER — Ambulatory Visit: Payer: Self-pay | Admitting: Family Medicine

## 2024-02-04 NOTE — Progress Notes (Signed)
 1  Your cholesterol levels are elevated.  Work on low cholesterol and lower carbs/sugars diet and  get exercise to try to lower your cholesterol.  2.  A1C(3 month average of sugars) is elevated.  This is considered PreDiabetes.  Work on diet-decrease sugars and starches and aim for 30 minutes of exercise 5 days/week to prevent progression to diabetes  3.  D is low-send in 50,000iu/wk #12/0 4.  Anemia worse-take the iron twice/day-has she seen gyn about the bleeding?  She should also see GI as well.  Colon was fine in October, but have they checked her upper stomach? 5.  Sed rate is slightly elevated and ANA positive-if agreeable, do referral to rheum-or has she seen anyone in past?

## 2024-02-05 ENCOUNTER — Other Ambulatory Visit: Payer: Self-pay | Admitting: *Deleted

## 2024-02-05 DIAGNOSIS — E559 Vitamin D deficiency, unspecified: Secondary | ICD-10-CM

## 2024-02-05 DIAGNOSIS — R768 Other specified abnormal immunological findings in serum: Secondary | ICD-10-CM

## 2024-02-05 DIAGNOSIS — R700 Elevated erythrocyte sedimentation rate: Secondary | ICD-10-CM

## 2024-02-05 MED ORDER — VITAMIN D (ERGOCALCIFEROL) 1.25 MG (50000 UNIT) PO CAPS: 50000.0000 [IU] | ORAL_CAPSULE | ORAL | 0 refills | Status: DC

## 2024-02-17 ENCOUNTER — Encounter: Payer: Self-pay | Admitting: Family Medicine

## 2024-02-18 ENCOUNTER — Encounter: Payer: Self-pay | Admitting: Family Medicine

## 2024-02-24 ENCOUNTER — Ambulatory Visit (INDEPENDENT_AMBULATORY_CARE_PROVIDER_SITE_OTHER): Admitting: Family Medicine

## 2024-02-24 ENCOUNTER — Encounter: Payer: Self-pay | Admitting: Family Medicine

## 2024-02-24 VITALS — BP 139/85 | HR 89 | Temp 98.2°F | Resp 18 | Ht 65.0 in | Wt 255.1 lb

## 2024-02-24 DIAGNOSIS — R7303 Prediabetes: Secondary | ICD-10-CM | POA: Diagnosis not present

## 2024-02-24 DIAGNOSIS — E7800 Pure hypercholesterolemia, unspecified: Secondary | ICD-10-CM | POA: Diagnosis not present

## 2024-02-24 DIAGNOSIS — I10 Essential (primary) hypertension: Secondary | ICD-10-CM

## 2024-02-24 DIAGNOSIS — Z6841 Body Mass Index (BMI) 40.0 and over, adult: Secondary | ICD-10-CM

## 2024-02-24 MED ORDER — ZEPBOUND 2.5 MG/0.5ML ~~LOC~~ SOAJ: 2.5000 mg | SUBCUTANEOUS | 0 refills | Status: DC

## 2024-02-24 MED ORDER — TIRZEPATIDE-WEIGHT MANAGEMENT 2.5 MG/0.5ML ~~LOC~~ SOLN: 2.5000 mg | SUBCUTANEOUS | 0 refills | Status: DC
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
## 2024-02-24 NOTE — Patient Instructions (Addendum)
 Colace 100-300mg  daily.  Possibly miralax .  Plenty of water.   Message in 1 month for next dose.   [email or web address] [email or web address]

## 2024-02-24 NOTE — Addendum Note (Signed)
 Addended by: WENDOLYN JENKINS HERO on: 02/24/2024 05:32 PM   Modules accepted: Orders

## 2024-02-25 ENCOUNTER — Telehealth: Payer: Self-pay

## 2024-02-25 ENCOUNTER — Other Ambulatory Visit (HOSPITAL_COMMUNITY): Payer: Self-pay

## 2024-02-25 NOTE — Telephone Encounter (Signed)
 Pharmacy Patient Advocate Encounter   Received notification from Onbase that prior authorization for Zepbound  2.5MG /0.5ML is required/requested.   Insurance verification completed.   The patient is insured through [employer] .   Per test claim: Drug not covered/ plan exclusion

## 2024-02-26 ENCOUNTER — Encounter: Payer: Self-pay | Admitting: Family Medicine

## 2024-02-26 ENCOUNTER — Other Ambulatory Visit (HOSPITAL_COMMUNITY): Payer: Self-pay

## 2024-02-26 NOTE — Telephone Encounter (Signed)
 Noted

## 2024-02-29 NOTE — Telephone Encounter (Signed)
 Pt responded to provider remarks via mychart. No further questions at this time.

## 2024-03-17 ENCOUNTER — Other Ambulatory Visit: Payer: Self-pay

## 2024-03-17 ENCOUNTER — Ambulatory Visit: Payer: Self-pay | Admitting: Pulmonary Disease

## 2024-03-17 MED ORDER — BREZTRI AEROSPHERE 160-9-4.8 MCG/ACT IN AERO: 2.0000 | INHALATION_SPRAY | Freq: Two times a day (BID) | RESPIRATORY_TRACT | 11 refills | Status: AC

## 2024-03-17 MED ORDER — BREZTRI AEROSPHERE 160-9-4.8 MCG/ACT IN AERO: 2.0000 | INHALATION_SPRAY | Freq: Two times a day (BID) | RESPIRATORY_TRACT | 11 refills | Status: DC

## 2024-03-17 NOTE — Telephone Encounter (Signed)
 FYI Only or Action Required?: Action required by provider: medication refill request.  Patient is followed in Pulmonology for asthma, last seen on 12/17/2023 by Hunsucker, Donnice SAUNDERS, MD.  Called Nurse Triage reporting Medication Refill.  Symptoms began today.  Symptoms are: rapidly improving.  Triage Disposition: Call PCP Now  Patient/caregiver understands and will follow disposition?: Yes      Copied from CRM [id number]. Topic: Clinical - Red Word Triage >> Mar 17, 2024  3:12 PM Leila C wrote: Red Word that prompted transfer to Nurse Triage: Patient [phone] states been having an asthma attack and lost inhaler Budeson-Glycopyrrol-Formoterol (BREZTRI  AEROSPHERE) 160-9-4.8 MCG/ACT AERO and wants to see if Dr. Annella can prescribe another inhaler? Patient states shortness of breath, couldn't take a deep breath, wheezing, lightheaded, dizziness, no pain. Please advise.   [id number] [place], [place] - [address] [place] [address] Phone: [phone] Fax: [phone]      Reason for Disposition  [1] Prescription refill request for ESSENTIAL medicine (i.e., likelihood of harm to patient if not taken) AND [2] triager unable to refill per department policy  Answer Assessment - Initial Assessment Questions Patient had asthma attack earlier today. Reports symptoms are greatly improved and declined any appointments. She has declined an appointment at this time. Patient would [facility].       1. DRUG NAME: What medicine do you need to have refilled?     Budeson-Glycopyrrol-Formoterol (BREZTRI  AEROSPHERE) 160-9-4.8 MCG/ACT AERO 2. REFILLS REMAINING: How many refills are remaining? Notes: The label on the medicine or pill bottle will show how many refills are remaining. If there are no refills remaining, then a renewal may be needed.     Has not checked  4. PRESCRIBER: Who prescribed it? Note: The prescribing  doctor or group is responsible for refill approvals..     Dr. Annella  5. PHARMACY: Have you contacted your pharmacy (drugstore)? Note: Some pharmacies will contact the doctor (or NP/PA).   [id number] - [place], [place] - [address] [place] [address] Phone: [phone] Fax: [phone]  6. SYMPTOMS: Do you have any symptoms?     Shortness of breath that is improving  Protocols used: Medication Refill and Renewal Call-A-AH

## 2024-03-18 NOTE — Telephone Encounter (Signed)
 I called and spoke to pt. Pt informed of Dr [employer]. Pt verbalized understanding. NFN

## 2024-03-22 ENCOUNTER — Telehealth: Payer: Self-pay | Admitting: Family Medicine

## 2024-03-22 ENCOUNTER — Telehealth: Payer: Self-pay | Admitting: *Deleted

## 2024-03-22 NOTE — Telephone Encounter (Signed)
 Copied from CRM [id number]. Topic: Appointments - Scheduling Inquiry for Clinic >> Mar 22, 2024 11:14 AM Ivette P wrote: Reason for CRM: PT called in due to a missed call to schedule mammogram, called [facility] and [facility] was not available. Pls reach out to pt to schedule.

## 2024-03-22 NOTE — Telephone Encounter (Signed)
 Called patient and left vm regarding scheduling a mammogram. Requested call back at earliest convenience.

## 2024-03-30 ENCOUNTER — Other Ambulatory Visit: Payer: Self-pay | Admitting: *Deleted

## 2024-03-30 ENCOUNTER — Encounter: Payer: Self-pay | Admitting: Family Medicine

## 2024-03-30 ENCOUNTER — Other Ambulatory Visit (HOSPITAL_COMMUNITY): Payer: Self-pay

## 2024-03-30 ENCOUNTER — Telehealth: Payer: Self-pay | Admitting: *Deleted

## 2024-03-30 DIAGNOSIS — E7800 Pure hypercholesterolemia, unspecified: Secondary | ICD-10-CM

## 2024-03-30 DIAGNOSIS — I10 Essential (primary) hypertension: Secondary | ICD-10-CM

## 2024-03-30 DIAGNOSIS — R7303 Prediabetes: Secondary | ICD-10-CM

## 2024-03-30 MED ORDER — ZEPBOUND 2.5 MG/0.5ML ~~LOC~~ SOAJ
2.5000 mg | SUBCUTANEOUS | 0 refills | Status: DC
Start: 2024-03-30 — End: 2024-05-09

## 2024-03-30 NOTE — Telephone Encounter (Signed)
 Rx sent.

## 2024-03-30 NOTE — Telephone Encounter (Signed)
 Copied from CRM [id number]. Topic: Clinical - Prescription Issue >> Mar 30, 2024  9:17 AM Montie POUR wrote: Reason for CRM:  [facility] said that Dr. Wendolyn needs to deny the prior authorization for tirzepatide  (ZEPBOUND ) 2.5 MG/0.5ML Pen so her Pharmacy can use a coupon . Please call Freddye with questions at [phone].

## 2024-03-30 NOTE — Telephone Encounter (Signed)
 Patient is calling to speak with Joen, reached out to  [facility] but no answer. Please call patient back.

## 2024-03-30 NOTE — Telephone Encounter (Signed)
 Patient notified of message below.

## 2024-04-02 ENCOUNTER — Other Ambulatory Visit: Payer: Self-pay | Admitting: Family Medicine

## 2024-04-02 DIAGNOSIS — I10 Essential (primary) hypertension: Secondary | ICD-10-CM

## 2024-04-04 ENCOUNTER — Ambulatory Visit: Admitting: Pulmonary Disease
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
## 2024-04-11 ENCOUNTER — Ambulatory Visit: Attending: Internal Medicine | Admitting: Internal Medicine

## 2024-04-11 ENCOUNTER — Ambulatory Visit

## 2024-04-11 ENCOUNTER — Ambulatory Visit (INDEPENDENT_AMBULATORY_CARE_PROVIDER_SITE_OTHER)

## 2024-04-11 ENCOUNTER — Encounter: Payer: Self-pay | Admitting: Internal Medicine

## 2024-04-11 VITALS — BP 109/69 | HR 86 | Resp 16 | Ht 65.5 in | Wt 254.6 lb

## 2024-04-11 DIAGNOSIS — M79642 Pain in left hand: Secondary | ICD-10-CM | POA: Diagnosis not present

## 2024-04-11 DIAGNOSIS — M2550 Pain in unspecified joint: Secondary | ICD-10-CM | POA: Insufficient documentation

## 2024-04-11 DIAGNOSIS — M7712 Lateral epicondylitis, left elbow: Secondary | ICD-10-CM

## 2024-04-11 DIAGNOSIS — M791 Myalgia, unspecified site: Secondary | ICD-10-CM

## 2024-04-11 DIAGNOSIS — M79641 Pain in right hand: Secondary | ICD-10-CM

## 2024-04-11 DIAGNOSIS — R768 Other specified abnormal immunological findings in serum: Secondary | ICD-10-CM | POA: Diagnosis not present

## 2024-04-11 NOTE — Patient Instructions (Signed)
 I am checking additional tests for any evidence of rheumatoid arthritis. Based on our exam, I see more evidence for muscle and early degenerative joint changes as causes for pain and stiffness.   For osteoarthritis several treatments may be beneficial:  - Topical antiinflammatory medicine such as diclofenac  or Voltaren  can be applied to  affected area as needed. Topical analgesics containing CBD, menthol, or lidocaine  can be tried. - Oral nonsteroidal antiinflammatory drugs (NSAIDs) such as ibuprofen , aleve, celebrex, or mobic are usually helpful for osteoarthritis. These can be taken intermittently or as needed. - Turmeric has some antiinflammatory effect similar to NSAIDs and may help, if taken as a supplement should not be taken above recommended doses.  - Physical therapy referral can discuss exercises or activity modification to improve symptoms or strength if needed. - Local steroid injection is an option if symptoms become worse and not controlled by the above options.   I recommend checking out the [facility]  patient-centered guide for fibromyalgia and chronic pain management: [email or web address]

## 2024-04-13 ENCOUNTER — Other Ambulatory Visit: Payer: Self-pay | Admitting: Family Medicine

## 2024-04-13 ENCOUNTER — Ambulatory Visit: Payer: Self-pay

## 2024-04-13 LAB — C-REACTIVE PROTEIN: CRP: 16.6 mg/L — ABNORMAL HIGH (ref <8.0)

## 2024-04-13 LAB — C3 AND C4
C3 Complement: 156 mg/dL (ref 83–193)
C4 Complement: 32 mg/dL (ref 15–57)

## 2024-04-13 LAB — CYCLIC CITRUL PEPTIDE ANTIBODY, IGG: Cyclic Citrullin Peptide Ab: <16 U

## 2024-04-13 LAB — SEDIMENTATION RATE: Sed Rate: 34 mm/h — ABNORMAL HIGH (ref 0–20)

## 2024-04-13 NOTE — Telephone Encounter (Signed)
 FYI Only or Action Required?: FYI only for provider.  Patient was last seen in primary care on 02/24/2024 by Wendolyn Jenkins Jansky, MD.  Called Nurse Triage reporting herpes lesions.  Symptoms began yesterday.  Interventions attempted: Nothing.  Symptoms are: unchanged.  Triage Disposition: Home Care- patient called for refill of valtrex , already done by Dr. Wendolyn, no action necessary  Patient/caregiver understands and will follow disposition?: Yes   Copied from CRM [id number]. Topic: Clinical - Red Word Triage >> Apr 13, 2024  4:53 PM Dedra B wrote: Red Word that prompted transfer to Nurse Triage: Pt has cold sore that's really painful. Warm transfer to nurse triage Reason for Disposition  1 or 2 small sores  Answer Assessment - Initial Assessment Questions 1. APPEARANCE of SORES: What do the sores look like?     Sores in private area 2. NUMBER: How many sores are there?     two 3. SIZE: How big is the largest sore?     Penny sized 4. LOCATION: Where are the sores located?     labia 5. ONSET: When did the sores begin?     One day ago 6. TENDER: Does it hurt when you touch it?  (Scale 1-10; or mild, moderate, severe)      Painful to sit or walk 7. CAUSE: What do you think is causing the sores?     Previously diagnosed with herpes 8. OTHER SYMPTOMS: Do you have any other symptoms? (e.g., fever, new weakness)     denies  Protocols used: Sores-A-AH

## 2024-04-14 NOTE — Telephone Encounter (Signed)
 FYI

## 2024-05-04 ENCOUNTER — Other Ambulatory Visit: Payer: Self-pay | Admitting: Family Medicine

## 2024-05-04 ENCOUNTER — Ambulatory Visit: Admitting: Family Medicine

## 2024-05-04 DIAGNOSIS — E559 Vitamin D deficiency, unspecified: Secondary | ICD-10-CM

## 2024-05-08 ENCOUNTER — Encounter: Payer: Self-pay | Admitting: Family Medicine

## 2024-05-09 ENCOUNTER — Other Ambulatory Visit: Payer: Self-pay | Admitting: Family Medicine

## 2024-05-09 MED ORDER — ZEPBOUND 5 MG/0.5ML ~~LOC~~ SOAJ: 5.0000 mg | SUBCUTANEOUS | 0 refills | Status: DC

## 2024-05-26 ENCOUNTER — Encounter: Payer: Self-pay | Admitting: Pulmonary Disease

## 2024-05-26 ENCOUNTER — Ambulatory Visit: Admitting: Pulmonary Disease

## 2024-05-26 VITALS — BP 116/78 | HR 76 | Temp 97.8°F | Ht 65.0 in | Wt 241.2 lb

## 2024-05-26 DIAGNOSIS — R053 Chronic cough: Secondary | ICD-10-CM

## 2024-05-26 NOTE — Patient Instructions (Signed)
 Please take omeprazole  prior to breakfast every morning for the next 4 to 6 weeks lets see if this helps improve the cough his cough can be related to reflux  Continue your inhalers  I will send a message to our pharmacist deceiving get you TEZSPIRE , I think this is really important to try this is I am still very suspicious of asthma and this is the last thing we can try to improve this  We will get a CT scan of your chest to make sure not missing other things, your chest x-rays in the past have been clear  Return to clinic in 3 months or sooner as needed with Dr. Annella
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
## 2024-05-27 ENCOUNTER — Encounter: Payer: Self-pay | Admitting: Pulmonary Disease

## 2024-05-30 ENCOUNTER — Other Ambulatory Visit

## 2024-05-30 ENCOUNTER — Ambulatory Visit (INDEPENDENT_AMBULATORY_CARE_PROVIDER_SITE_OTHER): Admitting: Family Medicine

## 2024-05-30 ENCOUNTER — Encounter: Payer: Self-pay | Admitting: Family Medicine

## 2024-05-30 ENCOUNTER — Other Ambulatory Visit: Payer: Self-pay | Admitting: Family Medicine

## 2024-05-30 VITALS — BP 122/80 | HR 81 | Temp 97.8°F | Ht 65.0 in | Wt 240.0 lb

## 2024-05-30 DIAGNOSIS — R053 Chronic cough: Secondary | ICD-10-CM

## 2024-05-30 DIAGNOSIS — Z6841 Body Mass Index (BMI) 40.0 and over, adult: Secondary | ICD-10-CM

## 2024-05-30 DIAGNOSIS — M797 Fibromyalgia: Secondary | ICD-10-CM | POA: Diagnosis not present

## 2024-05-30 DIAGNOSIS — K219 Gastro-esophageal reflux disease without esophagitis: Secondary | ICD-10-CM | POA: Insufficient documentation

## 2024-05-30 MED ORDER — CELECOXIB 200 MG PO CAPS
200.0000 mg | ORAL_CAPSULE | Freq: Two times a day (BID) | ORAL | 1 refills | Status: AC
Start: 2024-05-30 — End: ?

## 2024-05-30 MED ORDER — WEGOVY 1 MG/0.5ML ~~LOC~~ SOAJ: 1.0000 mg | SUBCUTANEOUS | 2 refills | Status: DC
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
## 2024-05-30 NOTE — Patient Instructions (Addendum)
 It was very nice to see you today!  Take celebrex twice/day.  Can use tylenol  as well Take colace 200mg  daily.  Take omeprazole  daily  Check pharmacy if they carry vials-if not then we can send to the zepbound  pharmacy   PLEASE NOTE:  If you had any lab tests please let us  know if you have not heard back within a few days. You may see your results on MyChart before we have a chance to review them but we will give you a call once they are reviewed by us . If we ordered any referrals today, please let us  know if you have not heard from their office within the next week.   Please try these tips to maintain a healthy lifestyle:  Eat most of your calories during the day when you are active. Eliminate processed foods including packaged sweets (pies, cakes, cookies), reduce intake of potatoes, white bread, white pasta, and white rice. Look for whole grain options, oat flour or almond flour.  Each meal should contain half fruits/vegetables, one quarter protein, and one quarter carbs (no bigger than a computer mouse).  Cut down on sweet beverages. This includes juice, soda, and sweet tea. Also watch fruit intake, though this is a healthier sweet option, it still contains natural sugar! Limit to 3 servings daily.  Drink at least 1 glass of water with each meal and aim for at least 8 glasses per day  Exercise at least 150 minutes every week.

## 2024-06-06 ENCOUNTER — Other Ambulatory Visit: Payer: Self-pay | Admitting: Family Medicine

## 2024-06-07 ENCOUNTER — Ambulatory Visit
Admission: RE | Admit: 2024-06-07 | Discharge: 2024-06-07 | Disposition: A | Discharge status: Home / Self Care | Source: Ambulatory Visit | Attending: Pulmonary Disease | Admitting: Pulmonary Disease

## 2024-06-07 DIAGNOSIS — R053 Chronic cough: Secondary | ICD-10-CM

## 2024-06-21 ENCOUNTER — Ambulatory Visit: Payer: Self-pay | Admitting: Pulmonary Disease

## 2024-06-22 ENCOUNTER — Telehealth: Payer: Self-pay

## 2024-06-22 NOTE — Telephone Encounter (Signed)
 Submitted a Prior Authorization request to [facility] for TEZSPIRE  via CoverMyMeds. Will update once we receive a response.  Key: [id number]

## 2024-06-22 NOTE — Telephone Encounter (Signed)
 Hunsucker, Beth SAUNDERS, MD to Me (Selected Message) [facility]   06/21/24  5:43 PM Result Note Clear CT scan.  Has had issues obtaining Tezspire .  Can we look into this? I think she really needs to continue biologic therapy.  Thank you.    Chart review shows last benefits investigation for Tezspire  in February showed PA good through August 2025. Initiating new benefits investigation in this thread.   Of note, she has previously started Tezspire  - received first dose in clinic on same day as visit with Dr. Annella, but seems she did not continue Tezspire  injections thereafter. Unclear why she did not continue Tezspire .

## 2024-06-30 NOTE — Telephone Encounter (Signed)
 Received notification from [facility] regarding a prior authorization for TEZSPIRE . Authorization has been APPROVED  to 12/28/2024. Approval letter sent to scan center.  Patient can fill through [facility]: [phone]  Authorization # [id number]

## 2024-07-01 ENCOUNTER — Other Ambulatory Visit (HOSPITAL_COMMUNITY): Payer: Self-pay

## 2024-07-01 NOTE — Telephone Encounter (Signed)
 Per test claim copay for 28 day supply is $924.16. Pt was already enrolled in copay card but it is rejecting for max member per voucher usage met. Will contact card company for further investigation.  Confirmation number: [id number] ID: [id number] Group: [id number] BIN: [id number] PCN: [id number]   Expiration: October 05, 2026 Copay card phone: [phone]

## 2024-07-06 ENCOUNTER — Other Ambulatory Visit (HOSPITAL_COMMUNITY): Payer: Self-pay

## 2024-07-06 NOTE — Telephone Encounter (Signed)
 Attempted to call Tezspire  copay line but they were not able to authenticate me because pt's address does not match what they have on file. Sent message to pt to see if she can contact them or knows what address they have. Will await response from pt.

## 2024-07-11 ENCOUNTER — Encounter: Admitting: Internal Medicine

## 2024-07-12 ENCOUNTER — Encounter: Payer: Self-pay | Admitting: Internal Medicine

## 2024-07-12 ENCOUNTER — Ambulatory Visit: Attending: Internal Medicine | Admitting: Internal Medicine

## 2024-07-12 VITALS — BP 133/72 | HR 75 | Temp 97.4°F | Resp 16 | Ht 65.0 in | Wt 235.0 lb

## 2024-07-12 DIAGNOSIS — M2550 Pain in unspecified joint: Secondary | ICD-10-CM | POA: Diagnosis not present

## 2024-07-12 DIAGNOSIS — K589 Irritable bowel syndrome without diarrhea: Secondary | ICD-10-CM | POA: Diagnosis not present

## 2024-07-12 DIAGNOSIS — M797 Fibromyalgia: Secondary | ICD-10-CM | POA: Diagnosis not present

## 2024-07-12 DIAGNOSIS — F4310 Post-traumatic stress disorder, unspecified: Secondary | ICD-10-CM | POA: Diagnosis not present
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
## 2024-07-12 NOTE — Patient Instructions (Signed)
 I recommend checking out the [facility] [place] patient-centered guide for fibromyalgia and chronic pain management: [email or web address]

## 2024-08-01 ENCOUNTER — Other Ambulatory Visit: Payer: Self-pay | Admitting: Family Medicine

## 2024-08-01 DIAGNOSIS — E559 Vitamin D deficiency, unspecified: Secondary | ICD-10-CM

## 2024-08-08 ENCOUNTER — Other Ambulatory Visit: Payer: Self-pay | Admitting: Family Medicine

## 2024-08-12 ENCOUNTER — Other Ambulatory Visit: Payer: Self-pay | Admitting: Family Medicine

## 2024-08-12 DIAGNOSIS — I10 Essential (primary) hypertension: Secondary | ICD-10-CM

## 2024-08-22 ENCOUNTER — Other Ambulatory Visit: Payer: Self-pay | Admitting: Family Medicine

## 2024-08-22 NOTE — Telephone Encounter (Signed)
 Closing encounter due to lack of f/u from patient. She has read each MyChart message and not responded

## 2024-08-23 ENCOUNTER — Other Ambulatory Visit: Payer: Self-pay

## 2024-08-23 MED ORDER — WEGOVY 1 MG/0.5ML ~~LOC~~ SOAJ: 1.0000 mg | SUBCUTANEOUS | 2 refills | Status: DC

## 2024-09-02 ENCOUNTER — Ambulatory Visit: Admitting: Family Medicine

## 2024-09-02 ENCOUNTER — Encounter: Payer: Self-pay | Admitting: Family Medicine

## 2024-09-02 VITALS — BP 118/70 | HR 80 | Temp 97.5°F | Ht 65.0 in | Wt 233.2 lb

## 2024-09-02 DIAGNOSIS — K219 Gastro-esophageal reflux disease without esophagitis: Secondary | ICD-10-CM | POA: Diagnosis not present

## 2024-09-02 DIAGNOSIS — I10 Essential (primary) hypertension: Secondary | ICD-10-CM

## 2024-09-02 DIAGNOSIS — R7303 Prediabetes: Secondary | ICD-10-CM

## 2024-09-02 DIAGNOSIS — M2550 Pain in unspecified joint: Secondary | ICD-10-CM

## 2024-09-02 DIAGNOSIS — R5383 Other fatigue: Secondary | ICD-10-CM | POA: Diagnosis not present

## 2024-09-02 DIAGNOSIS — E559 Vitamin D deficiency, unspecified: Secondary | ICD-10-CM

## 2024-09-02 DIAGNOSIS — N921 Excessive and frequent menstruation with irregular cycle: Secondary | ICD-10-CM

## 2024-09-02 MED ORDER — WEGOVY 9 MG PO TABS: 9.0000 mg | ORAL_TABLET | Freq: Every day | ORAL | 0 refills | Status: DC

## 2024-09-02 MED ORDER — HYDROXYZINE PAMOATE 25 MG PO CAPS: 25.0000 mg | ORAL_CAPSULE | Freq: Three times a day (TID) | ORAL | 3 refills | Status: AC | PRN

## 2024-09-02 MED ORDER — TELMISARTAN-HCTZ 40-12.5 MG PO TABS: 1.0000 | ORAL_TABLET | Freq: Every day | ORAL | 1 refills | Status: AC

## 2024-09-02 NOTE — Patient Instructions (Signed)
 It was very nice to see you today!  Message in 1 month for the increase in wegovy    PLEASE NOTE:  If you had any lab tests please let us  know if you have not heard back within a few days. You may see your results on MyChart before we have a chance to review them but we will give you a call once they are reviewed by us . If we ordered any referrals today, please let us  know if you have not heard from their office within the next week.   Please try these tips to maintain a healthy lifestyle:  Eat most of your calories during the day when you are active. Eliminate processed foods including packaged sweets (pies, cakes, cookies), reduce intake of potatoes, white bread, white pasta, and white rice. Look for whole grain options, oat flour or almond flour.  Each meal should contain half fruits/vegetables, one quarter protein, and one quarter carbs (no bigger than a computer mouse).  Cut down on sweet beverages. This includes juice, soda, and sweet tea. Also watch fruit intake, though this is a healthier sweet option, it still contains natural sugar! Limit to 3 servings daily.  Drink at least 1 glass of water with each meal and aim for at least 8 glasses per day  Exercise at least 150 minutes every week.
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
## 2024-09-03 LAB — IRON,TIBC AND FERRITIN PANEL
%SAT: 9 % — ABNORMAL LOW (ref 16–45)
Ferritin: 9 ng/mL — ABNORMAL LOW (ref 16–232)
Iron: 35 ug/dL — ABNORMAL LOW (ref 40–190)
TIBC: 378 ug/dL (ref 250–450)

## 2024-09-03 LAB — COMPLETE METABOLIC PANEL WITHOUT GFR
AG Ratio: 1.8 (calc) (ref 1.0–2.5)
ALT: 13 U/L (ref 6–29)
AST: 14 U/L (ref 10–35)
Albumin: 4.2 g/dL (ref 3.6–5.1)
Alkaline phosphatase (APISO): 84 U/L (ref 31–125)
BUN: 16 mg/dL (ref 7–25)
CO2: 32 mmol/L (ref 20–32)
Calcium: 9.1 mg/dL (ref 8.6–10.2)
Chloride: 102 mmol/L (ref 98–110)
Creat: 0.55 mg/dL (ref 0.50–0.99)
Globulin: 2.3 g/dL (ref 1.9–3.7)
Glucose, Bld: 78 mg/dL (ref 65–99)
Potassium: 4.5 mmol/L (ref 3.5–5.3)
Sodium: 138 mmol/L (ref 135–146)
Total Bilirubin: 0.2 mg/dL (ref 0.2–1.2)
Total Protein: 6.5 g/dL (ref 6.1–8.1)

## 2024-09-03 LAB — TSH: TSH: 3.91 m[IU]/L

## 2024-09-03 LAB — HEMOGLOBIN A1C
Hgb A1c MFr Bld: 5.3 %
Mean Plasma Glucose: 105 mg/dL
eAG (mmol/L): 5.8 mmol/L

## 2024-09-03 LAB — CBC WITH DIFFERENTIAL/PLATELET
Absolute Lymphocytes: 1467 {cells}/uL (ref 850–3900)
Absolute Monocytes: 631 {cells}/uL (ref 200–950)
Basophils Absolute: 30 {cells}/uL (ref 0–200)
Basophils Relative: 0.4 %
Eosinophils Absolute: 68 {cells}/uL (ref 15–500)
Eosinophils Relative: 0.9 %
HCT: 32.5 % — ABNORMAL LOW (ref 35.9–46.0)
Hemoglobin: 10 g/dL — ABNORMAL LOW (ref 11.7–15.5)
MCH: 27.2 pg (ref 27.0–33.0)
MCHC: 30.8 g/dL — ABNORMAL LOW (ref 31.6–35.4)
MCV: 88.6 fL (ref 81.4–101.7)
MPV: 10.1 fL (ref 7.5–12.5)
Monocytes Relative: 8.3 %
Neutro Abs: 5404 {cells}/uL (ref 1500–7800)
Neutrophils Relative %: 71.1 %
Platelets: 299 Thousand/uL (ref 140–400)
RBC: 3.67 Million/uL — ABNORMAL LOW (ref 3.80–5.10)
RDW: 13.6 % (ref 11.0–15.0)
Total Lymphocyte: 19.3 %
WBC: 7.6 Thousand/uL (ref 3.8–10.8)

## 2024-09-03 LAB — VITAMIN D 25 HYDROXY (VIT D DEFICIENCY, FRACTURES): Vit D, 25-Hydroxy: 51 ng/mL (ref 30–100)

## 2024-09-03 LAB — VITAMIN B12: Vitamin B-12: 286 pg/mL (ref 200–1100)

## 2024-09-03 LAB — MAGNESIUM: Magnesium: 2.1 mg/dL (ref 1.5–2.5)

## 2024-09-04 ENCOUNTER — Ambulatory Visit: Payer: Self-pay | Admitting: Family Medicine

## 2024-09-04 NOTE — Progress Notes (Signed)
 Hgb low, iron low, B12 low.  (Colonoscopy ok 1 yr ago).  Are periods heavy?  If so, see gyn.  Also, needs to be taking iron 325mg  daily and vitamin B12 1000 mcg daily.  If can't tolerate, we can refer to hematology as iron infusion may help legs/pain/fatigue as well some.  What would she like to do?

## 2024-09-06 NOTE — Progress Notes (Signed)
Called pt - left message for return call.

## 2024-09-21 ENCOUNTER — Encounter: Payer: Self-pay | Admitting: Family Medicine

## 2024-09-22 ENCOUNTER — Other Ambulatory Visit: Payer: Self-pay | Admitting: Family Medicine

## 2024-09-22 MED ORDER — WEGOVY 1 MG/0.5ML ~~LOC~~ SOAJ: 1.0000 mg | SUBCUTANEOUS | 2 refills | Status: AC

## 2024-12-09 ENCOUNTER — Ambulatory Visit: Admitting: Family Medicine
